# Patient Record
Sex: Male | Born: 1950
Health system: Southern US, Community
[De-identification: ages and names within clinical notes are randomized; demographics above are authoritative.]

## PROBLEM LIST (undated history)

## (undated) DIAGNOSIS — I5022 Chronic systolic (congestive) heart failure: Secondary | ICD-10-CM

## (undated) DIAGNOSIS — J449 Chronic obstructive pulmonary disease, unspecified: Secondary | ICD-10-CM

## (undated) DIAGNOSIS — E669 Obesity, unspecified: Secondary | ICD-10-CM

## (undated) DIAGNOSIS — E119 Type 2 diabetes mellitus without complications: Secondary | ICD-10-CM

---

## 2003-01-14 ENCOUNTER — Inpatient Hospital Stay (HOSPITAL_COMMUNITY): Admission: EM | Admit: 2003-01-14 | Discharge: 2003-01-16 | Payer: Self-pay | Admitting: Emergency Medicine

## 2003-01-15 ENCOUNTER — Encounter: Payer: Self-pay | Admitting: Internal Medicine

## 2005-01-01 ENCOUNTER — Encounter: Admission: RE | Admit: 2005-01-01 | Discharge: 2005-01-01 | Payer: Self-pay | Admitting: Family Medicine

## 2005-04-02 ENCOUNTER — Encounter: Admission: RE | Admit: 2005-04-02 | Discharge: 2005-04-02 | Payer: Self-pay | Admitting: Family Medicine

## 2007-10-09 ENCOUNTER — Encounter: Admission: RE | Admit: 2007-10-09 | Discharge: 2007-10-09 | Payer: Self-pay | Admitting: Family Medicine

## 2010-07-08 ENCOUNTER — Encounter: Payer: Self-pay | Admitting: Family Medicine

## 2010-11-02 NOTE — Discharge Summary (Signed)
[name], [name]                           ACCOUNT NO.:  [id number]   MEDICAL RECORD NO.:  [id number]                   PATIENT TYPE:  INP   LOCATION:  6727                                 FACILITY:  [facility]   PHYSICIAN:  Deirdre Peer. Polite, M.D.              DATE OF BIRTH:  28-May-1951   DATE OF ADMISSION:  01/14/2003  DATE OF DISCHARGE:  01/16/2003                                 DISCHARGE SUMMARY   PRIMARY CARE PHYSICIAN:  Donia Guiles, M.D.   DISCHARGE DIAGNOSES:  1. Escherichia coli urinary tract infection/urosepsis.  2. Possible benign prostatic hypertrophy.  3. Tobacco abuse.   DISCHARGE MEDICATIONS:  1. Flomax 0.4 mg daily x7 days.  2. Cipro 500 mg b.i.d. x5 more days to complete a 7-day course.   PROCEDURES:  1. CT of the abdomen on July 31.  No renal abnormality to suggest focal     pyelonephritis, mild atheromatous changes in the abdominal aorta, left     lower lobe granuloma.  2. CT of the pelvis July 31 revealed mild bladder wall thickening.   LABORATORY DATA:  WBC 17.1, RBC 4.69, hemoglobin 15.7, hematocrit 44.4, MCV  94.7, platelets 241.  Sodium 136, potassium 4, chloride 101, CO2 of 26,  glucose 107, BUN 10, creatinine 1.  Urinalysis with moderate leukocyte, 21-  50 WBCs.  Final culture revealed greater than 100,000 colonies of E. coli.  Blood cultures were negative to date.   DISPOSITION:  The patient was discharged home.   CONDITION ON DISCHARGE:  Stable.   HISTORY OF PRESENT ILLNESS:  This is a 60 year old man, who presented to  [facility] on the 30th after being evaluated by his primary care physician  for fever, abnormal UA, and lower back pain and tachycardia.  The patient  reported symptoms onset was approximately one day prior to presentation.  Initial evaluation noted the patient to be slightly febrile of 101.1,  tachycardic with a pulse of 140.  His rectal exam was negative for any  prostate tenderness, and patient was admitted for further  evaluation and  treatment.   HOSPITAL COURSE:  1. ESCHERICHIA COLI URINARY TRACT INFECTION/UROSEPSIS:  The patient was     admitted.  UA, CMET, and blood cultures were obtained.  He was hydrated     with IV fluids, started on IV Cipro.  Final culture results revealed E.     coli.  A CT of his abdomen and pelvis was done which ruled out     pyelonephritis.  At discharge, he continued to have a low-grade temp. of     99.1, had no lower back pain, was discharged on additional five days of     ciprofloxacin.  2. URINARY FREQUENCY POSSIBLY BENIGN PROSTATIC HYPERTROPHY:  The patient     reported urinary frequency with a possible history of BPH.  He was     discharged on a limited amount  of Flomax for further evaluation to be     left to his primary care physician.  3. TOBACCO ABUSE:  The patient was advised to quit smoking.   DISCHARGE INSTRUCTIONS AND FOLLOW-UP:  The patient was instructed to  complete his antibiotic therapy and to follow up with his primary care M.D.  for further evaluation and a repeat urinalysis.      Stephanie Swaziland, NP                      Deirdre Peer. Polite, M.D.    [name]  D:  01/16/2003  T:  01/17/2003  Job:  [id number]   cc:   Donia Guiles, M.D.  [address] [place]  [place] [address]  Fax: [phone]

## 2011-04-29 ENCOUNTER — Ambulatory Visit
Admission: RE | Admit: 2011-04-29 | Discharge: 2011-04-29 | Disposition: A | Discharge status: Home / Self Care | Payer: BC Managed Care – PPO | Source: Ambulatory Visit | Attending: Family Medicine | Admitting: Family Medicine

## 2011-04-29 ENCOUNTER — Other Ambulatory Visit: Payer: Self-pay | Admitting: Family Medicine

## 2011-04-29 DIAGNOSIS — S93409A Sprain of unspecified ligament of unspecified ankle, initial encounter: Secondary | ICD-10-CM

## 2012-08-31 ENCOUNTER — Ambulatory Visit (INDEPENDENT_AMBULATORY_CARE_PROVIDER_SITE_OTHER): Payer: Self-pay | Admitting: Surgery

## 2012-10-23 ENCOUNTER — Encounter (INDEPENDENT_AMBULATORY_CARE_PROVIDER_SITE_OTHER): Payer: Self-pay | Admitting: Surgery

## 2012-10-23 ENCOUNTER — Ambulatory Visit (INDEPENDENT_AMBULATORY_CARE_PROVIDER_SITE_OTHER): Payer: BC Managed Care – PPO | Admitting: Surgery

## 2012-10-23 VITALS — BP 104/72 | HR 84 | Temp 98.4°F | Resp 16 | Ht 67.0 in | Wt 201.6 lb

## 2012-10-23 DIAGNOSIS — K429 Umbilical hernia without obstruction or gangrene: Secondary | ICD-10-CM

## 2012-10-23 NOTE — Patient Instructions (Signed)
Hernia A hernia occurs when an internal organ pushes out through a weak spot in the abdominal wall. Hernias most commonly occur in the groin and around the navel. Hernias often can be pushed back into place (reduced). Most hernias tend to get worse over time. Some abdominal hernias can get stuck in the opening (irreducible or incarcerated hernia) and cannot be reduced. An irreducible abdominal hernia which is tightly squeezed into the opening is at risk for impaired blood supply (strangulated hernia). A strangulated hernia is a medical emergency. Because of the risk for an irreducible or strangulated hernia, surgery may be recommended to repair a hernia. CAUSES   Heavy lifting.  Prolonged coughing.  Straining to have a bowel movement.  A cut (incision) made during an abdominal surgery. HOME CARE INSTRUCTIONS   Bed rest is not required. You may continue your normal activities.  Avoid lifting more than 10 pounds (4.5 kg) or straining.  Cough gently. If you are a smoker it is best to stop. Even the best hernia repair can break down with the continual strain of coughing. Even if you do not have your hernia repaired, a cough will continue to aggravate the problem.  Do not wear anything tight over your hernia. Do not try to keep it in with an outside bandage or truss. These can damage abdominal contents if they are trapped within the hernia sac.  Eat a normal diet.  Avoid constipation. Straining over long periods of time will increase hernia size and encourage breakdown of repairs. If you cannot do this with diet alone, stool softeners may be used. SEEK IMMEDIATE MEDICAL CARE IF:   You have a fever.  You develop increasing abdominal pain.  You feel nauseous or vomit.  Your hernia is stuck outside the abdomen, looks discolored, feels hard, or is tender.  You have any changes in your bowel habits or in the hernia that are unusual for you.  You have increased pain or swelling around the  hernia.  You cannot push the hernia back in place by applying gentle pressure while lying down. MAKE SURE YOU:   Understand these instructions.  Will watch your condition.  Will get help right away if you are not doing well or get worse. Document Released: 06/03/2005 Document Revised: 08/26/2011 Document Reviewed: 01/21/2008 [employer] Patient Information 2013 [employer].  

## 2012-10-23 NOTE — Progress Notes (Signed)
Patient ID: [name], male   DOB: [birth year], 62 y.o.   MRN: [id number]  No chief complaint on file.   HPI [name] is a 62 y.o. male.                HPI patient sent at the request of [name] for umbilical hernia. It has been present for 4 years. He has mild to moderate discomfort with lifting. Otherwise, it does not bother him much. Denies nausea or vomiting. No change in bowel or bladder function. No redness or drainage from the umbilicus.  History reviewed. No pertinent past medical history.  History reviewed. No pertinent past surgical history.  No family history on file.  Social History History  Substance Use Topics  . Smoking status: Former Smoker    Start date: 10/24/2010  . Smokeless tobacco: Not on file  . Alcohol Use: Yes    Allergies  Allergen Reactions  . Iohexol      Desc: RASH     No current outpatient prescriptions on file.   No current facility-administered medications for this visit.    Review of Systems Review of Systems  Constitutional: Negative for fever, chills and unexpected weight change.  HENT: Negative for hearing loss, congestion, sore throat, trouble swallowing and voice change.   Eyes: Negative for visual disturbance.  Respiratory: Negative for cough and wheezing.   Cardiovascular: Negative for chest pain, palpitations and leg swelling.  Gastrointestinal: Negative for nausea, vomiting, abdominal pain, diarrhea, constipation, blood in stool, abdominal distention, anal bleeding and rectal pain.  Genitourinary: Negative for hematuria and difficulty urinating.  Musculoskeletal: Negative for arthralgias.  Skin: Negative for rash and wound.  Neurological: Negative for seizures, syncope, weakness and headaches.  Hematological: Negative for adenopathy. Does not bruise/bleed easily.  Psychiatric/Behavioral: Negative for confusion.    Blood pressure 104/72, pulse 84, temperature 98.4 F (36.9 C), resp. rate 16, height 5\' 7"  (1.702 m),  weight 201 lb 9.6 oz (91.445 kg).  Physical Exam Physical Exam  Constitutional: He is oriented to person, place, and time. He appears well-developed and well-nourished.  HENT:  Head: Normocephalic and atraumatic.  Eyes: EOM are normal. Pupils are equal, round, and reactive to light.  Neck: Normal range of motion. Neck supple.  Cardiovascular: Normal rate and regular rhythm.   Pulmonary/Chest: Effort normal and breath sounds normal.  Abdominal: Soft. He exhibits no distension. There is no tenderness. There is no rebound.    Musculoskeletal: Normal range of motion.  Neurological: He is alert and oriented to person, place, and time.  Skin: Skin is warm and dry.  Psychiatric: He has a normal mood and affect. His behavior is normal. Judgment and thought content normal.     Assessment    Umbilical hernia     Plan    Pt wishes to repair it at at a later time.  He will call to set up surgery.       [name] A. 10/23/2012, 9:42 AM

## 2014-03-22 ENCOUNTER — Ambulatory Visit: Payer: BC Managed Care – PPO

## 2014-03-29 ENCOUNTER — Ambulatory Visit: Payer: BC Managed Care – PPO

## 2014-04-05 ENCOUNTER — Ambulatory Visit: Payer: BC Managed Care – PPO

## 2014-06-03 ENCOUNTER — Other Ambulatory Visit: Payer: Self-pay | Admitting: Family Medicine

## 2014-06-06 ENCOUNTER — Other Ambulatory Visit: Payer: Self-pay | Admitting: Family Medicine

## 2014-06-06 DIAGNOSIS — R140 Abdominal distension (gaseous): Secondary | ICD-10-CM

## 2014-06-06 DIAGNOSIS — R1084 Generalized abdominal pain: Secondary | ICD-10-CM

## 2014-06-20 ENCOUNTER — Ambulatory Visit
Admission: RE | Admit: 2014-06-20 | Discharge: 2014-06-20 | Disposition: A | Discharge status: Home / Self Care | Payer: BLUE CROSS/BLUE SHIELD | Source: Ambulatory Visit | Attending: Family Medicine | Admitting: Family Medicine

## 2014-06-20 DIAGNOSIS — R140 Abdominal distension (gaseous): Secondary | ICD-10-CM

## 2014-06-20 DIAGNOSIS — R1084 Generalized abdominal pain: Secondary | ICD-10-CM

## 2014-06-20 MED ORDER — IOHEXOL 300 MG/ML  SOLN
125.0000 mL | Freq: Once | INTRAMUSCULAR | Status: AC | PRN
  Administered 2014-06-20: 125 mL via INTRAVENOUS

## 2014-07-13 ENCOUNTER — Encounter (HOSPITAL_COMMUNITY): Payer: Self-pay | Admitting: Family Medicine

## 2014-07-13 ENCOUNTER — Emergency Department (HOSPITAL_COMMUNITY): Payer: BLUE CROSS/BLUE SHIELD

## 2014-07-13 ENCOUNTER — Inpatient Hospital Stay (HOSPITAL_COMMUNITY)
Admission: EM | Admit: 2014-07-13 | Discharge: 2014-07-19 | DRG: 286 | Disposition: A | Discharge status: Home / Self Care | Payer: BLUE CROSS/BLUE SHIELD | Attending: Internal Medicine | Admitting: Internal Medicine

## 2014-07-13 DIAGNOSIS — K5900 Constipation, unspecified: Secondary | ICD-10-CM | POA: Diagnosis present

## 2014-07-13 DIAGNOSIS — I493 Ventricular premature depolarization: Secondary | ICD-10-CM

## 2014-07-13 DIAGNOSIS — J449 Chronic obstructive pulmonary disease, unspecified: Secondary | ICD-10-CM | POA: Diagnosis present

## 2014-07-13 DIAGNOSIS — Z7982 Long term (current) use of aspirin: Secondary | ICD-10-CM

## 2014-07-13 DIAGNOSIS — E1110 Type 2 diabetes mellitus with ketoacidosis without coma: Secondary | ICD-10-CM | POA: Diagnosis present

## 2014-07-13 DIAGNOSIS — E1165 Type 2 diabetes mellitus with hyperglycemia: Secondary | ICD-10-CM | POA: Diagnosis present

## 2014-07-13 DIAGNOSIS — Z794 Long term (current) use of insulin: Secondary | ICD-10-CM

## 2014-07-13 DIAGNOSIS — Z6830 Body mass index (BMI) 30.0-30.9, adult: Secondary | ICD-10-CM | POA: Diagnosis not present

## 2014-07-13 DIAGNOSIS — E669 Obesity, unspecified: Secondary | ICD-10-CM | POA: Diagnosis present

## 2014-07-13 DIAGNOSIS — I340 Nonrheumatic mitral (valve) insufficiency: Secondary | ICD-10-CM | POA: Diagnosis present

## 2014-07-13 DIAGNOSIS — R0600 Dyspnea, unspecified: Secondary | ICD-10-CM | POA: Diagnosis present

## 2014-07-13 DIAGNOSIS — R0602 Shortness of breath: Secondary | ICD-10-CM | POA: Diagnosis present

## 2014-07-13 DIAGNOSIS — E119 Type 2 diabetes mellitus without complications: Secondary | ICD-10-CM

## 2014-07-13 DIAGNOSIS — I5022 Chronic systolic (congestive) heart failure: Secondary | ICD-10-CM

## 2014-07-13 DIAGNOSIS — I272 Other secondary pulmonary hypertension: Secondary | ICD-10-CM | POA: Diagnosis present

## 2014-07-13 DIAGNOSIS — I471 Supraventricular tachycardia: Secondary | ICD-10-CM

## 2014-07-13 DIAGNOSIS — J189 Pneumonia, unspecified organism: Secondary | ICD-10-CM | POA: Diagnosis present

## 2014-07-13 DIAGNOSIS — I5041 Acute combined systolic (congestive) and diastolic (congestive) heart failure: Principal | ICD-10-CM

## 2014-07-13 DIAGNOSIS — Z87891 Personal history of nicotine dependence: Secondary | ICD-10-CM | POA: Diagnosis not present

## 2014-07-13 DIAGNOSIS — R000 Tachycardia, unspecified: Secondary | ICD-10-CM | POA: Diagnosis present

## 2014-07-13 DIAGNOSIS — Z888 Allergy status to other drugs, medicaments and biological substances status: Secondary | ICD-10-CM | POA: Diagnosis not present

## 2014-07-13 DIAGNOSIS — I255 Ischemic cardiomyopathy: Secondary | ICD-10-CM | POA: Diagnosis present

## 2014-07-13 DIAGNOSIS — G8929 Other chronic pain: Secondary | ICD-10-CM

## 2014-07-13 DIAGNOSIS — R079 Chest pain, unspecified: Secondary | ICD-10-CM | POA: Diagnosis present

## 2014-07-13 DIAGNOSIS — R140 Abdominal distension (gaseous): Secondary | ICD-10-CM

## 2014-07-13 DIAGNOSIS — R1011 Right upper quadrant pain: Secondary | ICD-10-CM

## 2014-07-13 DIAGNOSIS — I5021 Acute systolic (congestive) heart failure: Secondary | ICD-10-CM

## 2014-07-13 DIAGNOSIS — E8770 Fluid overload, unspecified: Secondary | ICD-10-CM

## 2014-07-13 HISTORY — DX: Chronic obstructive pulmonary disease, unspecified: J44.9

## 2014-07-13 HISTORY — DX: Type 2 diabetes mellitus without complications: E11.9

## 2014-07-13 HISTORY — DX: Obesity, unspecified: E66.9

## 2014-07-13 HISTORY — DX: Chronic systolic (congestive) heart failure: I50.22

## 2014-07-13 LAB — HEPATIC FUNCTION PANEL
ALT: 19 U/L (ref 0–53)
AST: 19 U/L (ref 0–37)
Albumin: 3.6 g/dL (ref 3.5–5.2)
Alkaline Phosphatase: 58 U/L (ref 39–117)
Bilirubin, Direct: 0.3 mg/dL (ref 0.0–0.5)
Indirect Bilirubin: 1.7 mg/dL — ABNORMAL HIGH (ref 0.3–0.9)
Total Bilirubin: 2 mg/dL — ABNORMAL HIGH (ref 0.3–1.2)
Total Protein: 7.2 g/dL (ref 6.0–8.3)

## 2014-07-13 LAB — CBC
HCT: 48.6 % (ref 39.0–52.0)
Hemoglobin: 16.4 g/dL (ref 13.0–17.0)
MCH: 31.7 pg (ref 26.0–34.0)
MCHC: 33.7 g/dL (ref 30.0–36.0)
MCV: 93.8 fL (ref 78.0–100.0)
Platelets: 228 10*3/uL (ref 150–400)
RBC: 5.18 MIL/uL (ref 4.22–5.81)
RDW: 13.8 % (ref 11.5–15.5)
WBC: 8.9 10*3/uL (ref 4.0–10.5)

## 2014-07-13 LAB — BASIC METABOLIC PANEL
Anion gap: 10 (ref 5–15)
BUN: 9 mg/dL (ref 6–23)
CO2: 25 mmol/L (ref 19–32)
Calcium: 9 mg/dL (ref 8.4–10.5)
Chloride: 101 mmol/L (ref 96–112)
Creatinine, Ser: 0.95 mg/dL (ref 0.50–1.35)
GFR calc Af Amer: >90 mL/min (ref >90)
GFR calc non Af Amer: 86 mL/min — ABNORMAL LOW (ref >90)
Glucose, Bld: 155 mg/dL — ABNORMAL HIGH (ref 70–99)
Potassium: 3.9 mmol/L (ref 3.5–5.1)
Sodium: 136 mmol/L (ref 135–145)

## 2014-07-13 LAB — STREP PNEUMONIAE URINARY ANTIGEN: Strep Pneumo Urinary Antigen: NEGATIVE

## 2014-07-13 LAB — I-STAT TROPONIN, ED: Troponin i, poc: 0.02 ng/mL (ref 0.00–0.08)

## 2014-07-13 LAB — TROPONIN I: Troponin I: <0.03 ng/mL (ref <0.031)

## 2014-07-13 LAB — TSH: TSH: 1.088 u[IU]/mL (ref 0.350–4.500)

## 2014-07-13 LAB — LIPASE, BLOOD: Lipase: 43 U/L (ref 11–59)

## 2014-07-13 LAB — BRAIN NATRIURETIC PEPTIDE: B Natriuretic Peptide: 877.1 pg/mL — ABNORMAL HIGH (ref 0.0–100.0)

## 2014-07-13 MED ORDER — ZOLPIDEM TARTRATE 5 MG PO TABS
5.0000 mg | ORAL_TABLET | Freq: Once | ORAL | Status: AC
  Administered 2014-07-13: 5 mg via ORAL
  Filled 2014-07-13: qty 1

## 2014-07-13 MED ORDER — ACETAMINOPHEN 650 MG RE SUPP: 650.0000 mg | Freq: Four times a day (QID) | RECTAL | Status: DC | PRN

## 2014-07-13 MED ORDER — AZITHROMYCIN 250 MG PO TABS
500.0000 mg | ORAL_TABLET | Freq: Once | ORAL | Status: AC
  Administered 2014-07-13: 500 mg via ORAL
  Filled 2014-07-13: qty 2

## 2014-07-13 MED ORDER — SODIUM CHLORIDE 0.9 % IV BOLUS (SEPSIS)
1000.0000 mL | Freq: Once | INTRAVENOUS | Status: AC
  Administered 2014-07-13: 1000 mL via INTRAVENOUS

## 2014-07-13 MED ORDER — ACETAMINOPHEN 325 MG PO TABS: 650.0000 mg | ORAL_TABLET | Freq: Four times a day (QID) | ORAL | Status: DC | PRN

## 2014-07-13 MED ORDER — ASPIRIN EC 81 MG PO TBEC
81.0000 mg | DELAYED_RELEASE_TABLET | Freq: Every day | ORAL | Status: DC
  Administered 2014-07-13 – 2014-07-19 (×7): 81 mg via ORAL
  Filled 2014-07-13 (×6): qty 1

## 2014-07-13 MED ORDER — SODIUM CHLORIDE 0.9 % IV SOLN: 250.0000 mL | INTRAVENOUS | Status: DC | PRN

## 2014-07-13 MED ORDER — SODIUM CHLORIDE 0.9 % IJ SOLN
3.0000 mL | Freq: Two times a day (BID) | INTRAMUSCULAR | Status: DC
  Administered 2014-07-14 – 2014-07-19 (×10): 3 mL via INTRAVENOUS

## 2014-07-13 MED ORDER — SODIUM CHLORIDE 0.9 % IJ SOLN
3.0000 mL | Freq: Two times a day (BID) | INTRAMUSCULAR | Status: DC
Start: 2014-07-13 — End: 2014-07-15
  Administered 2014-07-13 – 2014-07-14 (×2): 3 mL via INTRAVENOUS

## 2014-07-13 MED ORDER — ENOXAPARIN SODIUM 40 MG/0.4ML ~~LOC~~ SOLN
40.0000 mg | SUBCUTANEOUS | Status: DC
  Administered 2014-07-13 – 2014-07-17 (×5): 40 mg via SUBCUTANEOUS
  Filled 2014-07-13 (×6): qty 0.4

## 2014-07-13 MED ORDER — BUDESONIDE-FORMOTEROL FUMARATE 160-4.5 MCG/ACT IN AERO
1.0000 | INHALATION_SPRAY | Freq: Two times a day (BID) | RESPIRATORY_TRACT | Status: DC
  Administered 2014-07-13 – 2014-07-19 (×12): 1 via RESPIRATORY_TRACT
  Filled 2014-07-13: qty 6

## 2014-07-13 MED ORDER — PREDNISONE 50 MG PO TABS
50.0000 mg | ORAL_TABLET | Freq: Once | ORAL | Status: AC
  Administered 2014-07-14: 50 mg via ORAL
  Filled 2014-07-13: qty 1

## 2014-07-13 MED ORDER — CEFTRIAXONE SODIUM IN DEXTROSE 20 MG/ML IV SOLN
1.0000 g | INTRAVENOUS | Status: DC
  Administered 2014-07-14 – 2014-07-17 (×4): 1 g via INTRAVENOUS
  Filled 2014-07-13 (×5): qty 50

## 2014-07-13 MED ORDER — DEXTROSE 5 % IV SOLN
500.0000 mg | INTRAVENOUS | Status: DC
  Administered 2014-07-14: 500 mg via INTRAVENOUS
  Filled 2014-07-13 (×2): qty 500

## 2014-07-13 MED ORDER — OXYCODONE HCL 5 MG PO TABS: 5.0000 mg | ORAL_TABLET | ORAL | Status: DC | PRN

## 2014-07-13 MED ORDER — CEFTRIAXONE SODIUM 1 G IJ SOLR
1.0000 g | Freq: Once | INTRAMUSCULAR | Status: AC
  Administered 2014-07-13: 1 g via INTRAVENOUS
  Filled 2014-07-13: qty 10

## 2014-07-13 MED ORDER — SODIUM CHLORIDE 0.9 % IJ SOLN: 3.0000 mL | INTRAMUSCULAR | Status: DC | PRN

## 2014-07-13 MED ORDER — PREDNISONE 20 MG PO TABS
50.0000 mg | ORAL_TABLET | Freq: Once | ORAL | Status: AC
  Administered 2014-07-13: 50 mg via ORAL
  Filled 2014-07-13: qty 3

## 2014-07-13 MED ORDER — MORPHINE SULFATE 2 MG/ML IJ SOLN
1.0000 mg | INTRAMUSCULAR | Status: DC | PRN
Start: 2014-07-13 — End: 2014-07-20

## 2014-07-13 MED ORDER — PREDNISONE 50 MG PO TABS
50.0000 mg | ORAL_TABLET | Freq: Once | ORAL | Status: AC
  Administered 2014-07-13: 50 mg via ORAL
  Filled 2014-07-13: qty 1

## 2014-07-13 MED ORDER — ONDANSETRON HCL 4 MG PO TABS: 4.0000 mg | ORAL_TABLET | Freq: Four times a day (QID) | ORAL | Status: DC | PRN

## 2014-07-13 MED ORDER — FUROSEMIDE 10 MG/ML IJ SOLN
20.0000 mg | Freq: Once | INTRAMUSCULAR | Status: AC
  Administered 2014-07-13: 20 mg via INTRAVENOUS
  Filled 2014-07-13: qty 2

## 2014-07-13 MED ORDER — ONDANSETRON HCL 4 MG/2ML IJ SOLN: 4.0000 mg | Freq: Four times a day (QID) | INTRAMUSCULAR | Status: DC | PRN

## 2014-07-13 NOTE — ED Notes (Signed)
Pt here for non radiating left sided chest pain that started yesterday. sts that he had a colonoscopy Monday and the doctors were concerened due to high heart rate. Pt tachy at 130 at triage. Pt sts has been having abdominal pain and the feeling of being bloated for a while.

## 2014-07-13 NOTE — ED Notes (Signed)
Admitting MD at bedside.

## 2014-07-13 NOTE — H&P (Signed)
[facility] History and Physical  LON KLIPPEL PRF:[id number] DOB: 1950/07/10 DOA: 07/13/2014   PCP: Mayra Neer, MD  Specialists: He is been seen recently by Dr. Oletta Lamas with gastroenterology  Chief Complaint: Left-sided chest pain with cough and shortness of breath  HPI: [name] is a 64 y.o. male with a past medical history of COPD not on home oxygen who otherwise does not have any other health problems who was in his usual state of health till about 2-3 days ago when he started developing pain in the left side of his chest. He describes it as a sharp pain without any radiation. 4-5 out of 10 in intensity. Associated with shortness of breath and a cough with clear expectoration without any blood. He has also noticed some difficulty breathing with lying down. Denies any fever per se. No chills. Has been a little dizzy. Has noticed some leg swelling. The chest pain increases with coughing and deep breathing. He had cold like symptoms earlier this month. His grandchildren have been sick. Has had nausea but no vomiting. He's never had similar complaints before. For the past many months he's had abdominal bloating and distention for which he has been worked up by his gastroenterologist. He underwent a colonoscopy on Monday, which apparently was unremarkable.  Home Medications: Prior to Admission medications   Medication Sig Start Date End Date Taking? Authorizing Provider  albuterol (PROVENTIL HFA;VENTOLIN HFA) 108 (90 BASE) MCG/ACT inhaler Inhale 1 puff into the lungs every 6 (six) hours as needed for wheezing or shortness of breath (sometimes allergy).   Yes Historical Provider, MD  budesonide-formoterol (SYMBICORT) 160-4.5 MCG/ACT inhaler Inhale 1 puff into the lungs 2 (two) times daily.   Yes Historical Provider, MD    Allergies:  Allergies  Allergen Reactions  . Iodine Solution [Povidone Iodine] Rash    Past Medical History: Past Medical History  Diagnosis Date  . COPD  (chronic obstructive pulmonary disease)     Social History: He lives in [place] with his wife. Quit smoking many years ago. No alcohol use. No illicit drug use. Usually independent with daily activities.  Family History: He denies any health problems in his family.  Review of Systems - History obtained from the patient General ROS: positive for  - fatigue Psychological ROS: negative Ophthalmic ROS: negative ENT ROS: negative Allergy and Immunology ROS: negative Hematological and Lymphatic ROS: negative Endocrine ROS: negative Respiratory ROS: As in history of present illness Cardiovascular ROS: As in history of present illness Gastrointestinal ROS: As in history of present illness Genito-Urinary ROS: no dysuria, trouble voiding, or hematuria Musculoskeletal ROS: negative Neurological ROS: no TIA or stroke symptoms Dermatological ROS: negative  Physical Examination  Filed Vitals:   07/13/14 0945 07/13/14 1000 07/13/14 1130 07/13/14 1200  BP: 116/91 121/93 112/91 110/95  Pulse: 130 128 126 128  Temp:      Resp: _0 SpO2: 98% 93% 95% 96%    BP 110/95 mmHg  Pulse 128  Temp(Src) 97.6 F (36.4 C)  Resp 23  SpO2 96%  General appearance: alert, cooperative, appears stated age, no distress and moderately obese Head: Normocephalic, without obvious abnormality, atraumatic Eyes: conjunctivae/corneas clear. PERRL, EOM's intact. Throat: lips, mucosa, and tongue normal; teeth and gums normal Neck: no adenopathy, no carotid bruit, no JVD, supple, symmetrical, trachea midline and thyroid not enlarged, symmetric, no tenderness/mass/nodules Resp: Crackles left base. No wheezing. No rhonchi. Cardio: regular rate and rhythm, S1, S2 normal, no murmur, click, rub or gallop  GI: soft, non-tender; bowel sounds normal; no masses,  no organomegaly Extremities: Minimal edema bilateral lower extremities. Pulses: 2+ and symmetric Skin: Skin color, texture, turgor normal. No rashes or  lesions Lymph nodes: Cervical, supraclavicular, and axillary nodes normal. Neurologic: No focal deficits.  Laboratory Data: Results for orders placed or performed during the hospital encounter of 07/13/14 (from the past 48 hour(s))  Basic metabolic panel     Status: Abnormal   Collection Time: 07/13/14  9:13 AM  Result Value Ref Range   Sodium 136 135 - 145 mmol/L   Potassium 3.9 3.5 - 5.1 mmol/L   Chloride 101 96 - 112 mmol/L   CO2 25 19 - 32 mmol/L   Glucose, Bld 155 (H) 70 - 99 mg/dL   BUN 9 6 - 23 mg/dL   Creatinine, Ser 0.95 0.50 - 1.35 mg/dL   Calcium 9.0 8.4 - 10.5 mg/dL   GFR calc non Af Amer 86 (L) >90 mL/min   GFR calc Af Amer >90 >90 mL/min    Comment: (NOTE) The eGFR has been calculated using the CKD EPI equation. This calculation has not been validated in all clinical situations. eGFR's persistently <90 mL/min signify possible Chronic Kidney Disease.    Anion gap 10 5 - 15  CBC     Status: None   Collection Time: 07/13/14  9:23 AM  Result Value Ref Range   WBC 8.9 4.0 - 10.5 K/uL   RBC 5.18 4.22 - 5.81 MIL/uL   Hemoglobin 16.4 13.0 - 17.0 g/dL   HCT 48.6 39.0 - 52.0 %   MCV 93.8 78.0 - 100.0 fL   MCH 31.7 26.0 - 34.0 pg   MCHC 33.7 30.0 - 36.0 g/dL   RDW 13.8 11.5 - 15.5 %   Platelets 228 150 - 400 K/uL  BNP (order ONLY if patient complains of dyspnea/SOB AND you have documented it for THIS visit)     Status: Abnormal   Collection Time: 07/13/14  9:23 AM  Result Value Ref Range   B Natriuretic Peptide 877.1 (H) 0.0 - 100.0 pg/mL  I-stat troponin, ED (not at [facility])     Status: None   Collection Time: 07/13/14  9:29 AM  Result Value Ref Range   Troponin i, poc 0.02 0.00 - 0.08 ng/mL   Comment 3            Comment: Due to the release kinetics of cTnI, a negative result within the first hours of the onset of symptoms does not rule out myocardial infarction with certainty. If myocardial infarction is still suspected, repeat the test at appropriate  intervals.   Hepatic function panel     Status: Abnormal   Collection Time: 07/13/14  9:57 AM  Result Value Ref Range   Total Protein 7.2 6.0 - 8.3 g/dL   Albumin 3.6 3.5 - 5.2 g/dL   AST 19 0 - 37 U/L   ALT 19 0 - 53 U/L   Alkaline Phosphatase 58 39 - 117 U/L   Total Bilirubin 2.0 (H) 0.3 - 1.2 mg/dL   Bilirubin, Direct 0.3 0.0 - 0.5 mg/dL    Comment: Please note change in reference range.   Indirect Bilirubin 1.7 (H) 0.3 - 0.9 mg/dL  Lipase, blood     Status: None   Collection Time: 07/13/14  9:57 AM  Result Value Ref Range   Lipase 43 11 - 59 U/L    Radiology Reports: Dg Chest 2 View  07/13/2014   CLINICAL DATA:  64 year old male with shortness of breath cough and chest tightness for 2 days. Current history of COPD. Initial encounter.  EXAM: CHEST  2 VIEW  COMPARISON:  CT Abdomen and Pelvis 06/20/2014. Chest radiographs 06/18/2014 and earlier.  FINDINGS: Stable somewhat low lung volumes. Vague increased left mid lung opacity from the prior studies, but at the same time increased lower lobe opacity appears regressed. No pneumothorax. No pleural effusion identified. Stable cardiac size and mediastinal contours. Visualized tracheal air column is within normal limits. Underlying chronic increased interstitial markings. No acute osseous abnormality identified.  IMPRESSION: Vague increased left mid lung opacity compatible with acute pneumonia superimposed on chronic lung disease.  Post treatment radiographs recommended to document resolution.   Electronically Signed   By: Lars Pinks M.D.   On: 07/13/2014 09:39   US Abdomen Complete  07/13/2014   CLINICAL DATA:  64 year old male with 1 day history of right upper quadrant pain  EXAM: ULTRASOUND ABDOMEN COMPLETE  COMPARISON:  None.  FINDINGS: Gallbladder: No gallstones or wall thickening visualized. No sonographic Murphy sign noted.  Common bile duct: Diameter: Within normal limits at 5 mm  Liver: No focal lesion identified. Within normal limits in  parenchymal echogenicity. The main portal vein is patent with normal hepatopetal flow.  IVC: No abnormality visualized.  Pancreas: Visualized portion unremarkable. The tail is largely obscured by overlying bowel gas.  Spleen: Size and appearance within normal limits.  Right Kidney: Length: 13.2 cm. Echogenicity within normal limits. No mass or hydronephrosis visualized.  Left Kidney: Length: 13.6 cm. Echogenicity within normal limits. No mass or hydronephrosis visualized.  Abdominal aorta: Largely obscured by bowel gas. However, recent CTA of the abdomen and pelvis demonstrates a normal caliber aorta.  Other findings:  Small right-sided pleural effusion.  IMPRESSION: 1. No acute abnormality in the abdomen to explain the patient's clinical symptoms. 2. Small right-sided pleural effusion noted incidentally.   Electronically Signed   By: Jacqulynn Cadet M.D.   On: 07/13/2014 11:19    Electrocardiogram: EKG shows sinus tachycardia at 130 bpm. Normal axis. Intervals are normal. No concerning ST changes. Nonspecific T wave changes.  Problem List  Principal Problem:   CAP (community acquired pneumonia) Active Problems:   Chest pain   Sinus tachycardia   Dyspnea   Assessment: This is a 64 year old Caucasian male who presents with left-sided chest pain. His chest x-ray raises the possibility of an infiltrate in the left lower lung. However, he is afebrile and has normal WBC. He also has pleuritic chest pain. He also was found to be tachycardic. BNP is elevated. He has symptoms suggestive of orthopnea. Pneumonia could be responsible for his symptoms but he needs further evaluation.  Plan: #1 chest pain and dyspnea with sinus tachycardia: He likely has pneumonia, which could be the reason for his presentation. But, surprisingly, WBC is normal and he is afebrile. Since etiology is not completely clear he will benefit from a CT angiogram of his chest. He has reported allergy to iodine. He'll be placed on a 13  hour protocol with steroids. CT scan will be done subsequently. In the meantime, monitor him on telemetry. Considering his history of orthopnea we will order echocardiogram. Troponin will be checked as well. Check TSH  #2 Possible community-acquired pneumonia: Treat with ceftriaxone and azithromycin for now.  #3 chronic symptoms of abdominal distention: Workup as outpatient. Colonoscopy on Monday was apparently unremarkable per patient. Report not available in our system. Monitor clinically. He did have an ultrasound of his  abdomen in the ED which did not show any concerning findings. LFTs were normal except for mild hyperbilirubinemia. No further workup as inpatient.   #4 history of COPD: Continue with his inhaler.  DVT Prophylaxis: Lovenox Code Status: Full code Family Communication: Discussed with the patient  Disposition Plan: Admit to telemetry   Further management decisions will depend on results of further testing and patient's response to treatment.   [facility]  [facility] Pager [phone]  If 7PM-7AM, please contact night-coverage [email or web address] Password [facility]  07/13/2014, 12:19 PM

## 2014-07-13 NOTE — ED Notes (Signed)
Attempted report 

## 2014-07-13 NOTE — Progress Notes (Addendum)
Pt having quadrigeminy PVCs. Pt asymptomatic. HR in the 130s. MD made aware. New orders given. Will continue to monitor patient for further changes in condition.

## 2014-07-13 NOTE — ED Provider Notes (Signed)
CSN: [id number]     Arrival date & time 07/13/14  4098 History   First MD Initiated Contact with Patient 07/13/14 0912     Chief Complaint  Patient presents with  . Chest Pain     (Consider location/radiation/quality/duration/timing/severity/associated sxs/prior Treatment) Patient is a 64 y.o. male presenting with chest pain. The history is provided by the patient.  Chest Pain Pain location:  L chest Pain quality: aching and sharp   Pain radiates to:  Does not radiate Pain radiates to the back: no   Pain severity:  Moderate Onset quality:  Gradual Duration:  4 days Timing:  Intermittent Progression:  Partially resolved Chronicity:  New Relieved by:  None tried Worsened by:  Coughing Ineffective treatments:  None tried Associated symptoms: abdominal pain, cough, nausea and shortness of breath   Associated symptoms: no fever, no lower extremity edema and not vomiting   Associated symptoms comment:  Patient states for the last 1-2 months he's had worsening upper abdominal pain, bloating, nausea that he saw his regular doctor for the beginning of January and had a normal CT scan and saw GI on Monday for a colonoscopy which was normal. Also in the last few weeks he's noted worsening shortness of breath and now having left-sided chest pain and a productive cough. He denies fever is still using his inhalers. Also of note when he went in for his colonoscopy 3 days ago they said he had an elevated heart rate which is the first time he ever heard of that. They thought it may be due to dehydration and the colon prep. Risk factors: male sex   Risk factors: no diabetes mellitus, no hypertension, no immobilization, no smoking and no surgery   Risk factors comment:  Copd   Past Medical History  Diagnosis Date  . COPD (chronic obstructive pulmonary disease)    History reviewed. No pertinent past surgical history. History reviewed. No pertinent family history. History  Substance Use Topics  .  Smoking status: Former Smoker    Start date: 10/24/2010  . Smokeless tobacco: Not on file  . Alcohol Use: Yes    Review of Systems  Constitutional: Negative for fever.  Respiratory: Positive for cough and shortness of breath.   Cardiovascular: Positive for chest pain.  Gastrointestinal: Positive for nausea and abdominal pain. Negative for vomiting.  All other systems reviewed and are negative.     Allergies  Iohexol  Home Medications   Prior to Admission medications   Not on File   BP 130/93 mmHg  Pulse 132  Temp(Src) 97.6 F (36.4 C)  Resp 20  SpO2 95% Physical Exam  Constitutional: He is oriented to person, place, and time. He appears well-developed and well-nourished. No distress.  HENT:  Head: Normocephalic and atraumatic.  Mouth/Throat: Oropharynx is clear and moist.  Eyes: Conjunctivae and EOM are normal. Pupils are equal, round, and reactive to light.  Neck: Normal range of motion. Neck supple.  Cardiovascular: Regular rhythm and intact distal pulses.  Tachycardia present.   No murmur heard. Pulmonary/Chest: Effort normal. Tachypnea noted. No respiratory distress. He has no wheezes. He has rhonchi in the left lower field. He has no rales.  Abdominal: Soft. Bowel sounds are normal. He exhibits distension. There is tenderness in the right upper quadrant and epigastric area. There is no rebound and no guarding.  Musculoskeletal: Normal range of motion. He exhibits no edema or tenderness.  Neurological: He is alert and oriented to person, place, and time.  Skin: Skin  is warm and dry. No rash noted. No erythema.  Psychiatric: He has a normal mood and affect. His behavior is normal.  Nursing note and vitals reviewed.   ED Course  Procedures (including critical care time) Labs Review Labs Reviewed  BASIC METABOLIC PANEL - Abnormal; Notable for the following:    Glucose, Bld 155 (*)    GFR calc non Af Amer 86 (*)    All other components within normal limits   BRAIN NATRIURETIC PEPTIDE - Abnormal; Notable for the following:    B Natriuretic Peptide 877.1 (*)    All other components within normal limits  HEPATIC FUNCTION PANEL - Abnormal; Notable for the following:    Total Bilirubin 2.0 (*)    Indirect Bilirubin 1.7 (*)    All other components within normal limits  CBC  LIPASE, BLOOD  I-STAT TROPOININ, ED    Imaging Review Dg Chest 2 View  07/13/2014   CLINICAL DATA:  64 year old male with shortness of breath cough and chest tightness for 2 days. Current history of COPD. Initial encounter.  EXAM: CHEST  2 VIEW  COMPARISON:  CT Abdomen and Pelvis 06/20/2014. Chest radiographs 06/18/2014 and earlier.  FINDINGS: Stable somewhat low lung volumes. Vague increased left mid lung opacity from the prior studies, but at the same time increased lower lobe opacity appears regressed. No pneumothorax. No pleural effusion identified. Stable cardiac size and mediastinal contours. Visualized tracheal air column is within normal limits. Underlying chronic increased interstitial markings. No acute osseous abnormality identified.  IMPRESSION: Vague increased left mid lung opacity compatible with acute pneumonia superimposed on chronic lung disease.  Post treatment radiographs recommended to document resolution.   Electronically Signed   By: Augusto Gamble M.D.   On: 07/13/2014 09:39     EKG Interpretation   Date/Time:  Wednesday July 13 2014 09:09:02 EST Ventricular Rate:  130 PR Interval:  152 QRS Duration: 96 QT Interval:  302 QTC Calculation: 444 R Axis:   -6 Text Interpretation:  Sinus tachycardia Anteroseptal infarct , age  undetermined No significant change since last tracing Confirmed by  [facility]  MD, Alphonzo Lemmings ([id number]) on 07/13/2014 9:31:39 AM      MDM   Final diagnoses:  RUQ pain  CAP (community acquired pneumonia)  Hypervolemia, unspecified hypervolemia type    Patient with gradual worsening shortness of breath over the last few weeks with  productive cough and now left-sided chest pain which is worse after prolonged coughing. Of note he's also had GI issues for over a month where he has bloating, nausea, epigastric pain. Patient had a normal CT scan and saw GI on Monday where he underwent colonoscopy. Of note when he arrived for his colonoscopy has heart rate was elevated and up to 150 throughout the entire procedure. His colonoscopy was normal except for 2 small polyps that were removed. He still continues to have the abdominal pain and bloating. He does have a history of COPD and is using Spiriva daily and pro-air intermittently. Last use of pro-air was last night. On arrival here patient's oxygen saturation is 95% on room air and his heart rate is in the 130s. On EKG is sinus tachycardia.  Patient is low risk for PE and no prior cardiac history. Troponin is normal however chest x-ray shows signs of a new left lower lobe opacity consistent with pneumonia which would describe the cause of his shortness of breath and new productive cough.  Also with his abdominal issues he has not had evaluation of  his gallbladder and given his symptoms feel he is a candidate for a right upper quadrant ultrasound.  CBC, CMP, lipase, right upper quadrant ultrasound pending  11:53 AM Labs with significant elevated BNP.  U/S normal.  And on further evaluation of the patient he states his shortness of breath is much more severe with lying down concerning for possible new fluid overload. Patient was given Lasix, Rocephin, azithromycin and admitted for further care  Gwyneth Sprout, MD 07/13/14 1155

## 2014-07-14 ENCOUNTER — Inpatient Hospital Stay (HOSPITAL_COMMUNITY): Payer: BLUE CROSS/BLUE SHIELD

## 2014-07-14 ENCOUNTER — Encounter (HOSPITAL_COMMUNITY): Payer: Self-pay

## 2014-07-14 DIAGNOSIS — I209 Angina pectoris, unspecified: Secondary | ICD-10-CM

## 2014-07-14 DIAGNOSIS — R1011 Right upper quadrant pain: Secondary | ICD-10-CM

## 2014-07-14 DIAGNOSIS — G8929 Other chronic pain: Secondary | ICD-10-CM

## 2014-07-14 DIAGNOSIS — R1010 Upper abdominal pain, unspecified: Secondary | ICD-10-CM

## 2014-07-14 LAB — CBC
HCT: 44.9 % (ref 39.0–52.0)
HEMOGLOBIN: 14.8 g/dL (ref 13.0–17.0)
MCH: 31.9 pg (ref 26.0–34.0)
MCHC: 33 g/dL (ref 30.0–36.0)
MCV: 96.8 fL (ref 78.0–100.0)
PLATELETS: 208 10*3/uL (ref 150–400)
RBC: 4.64 MIL/uL (ref 4.22–5.81)
RDW: 13.9 % (ref 11.5–15.5)
WBC: 5 10*3/uL (ref 4.0–10.5)

## 2014-07-14 LAB — LEGIONELLA ANTIGEN, URINE

## 2014-07-14 LAB — TROPONIN I

## 2014-07-14 LAB — BASIC METABOLIC PANEL
ANION GAP: 7 (ref 5–15)
BUN: 12 mg/dL (ref 6–23)
CHLORIDE: 102 mmol/L (ref 96–112)
CO2: 25 mmol/L (ref 19–32)
CREATININE: 0.99 mg/dL (ref 0.50–1.35)
Calcium: 8.3 mg/dL — ABNORMAL LOW (ref 8.4–10.5)
GFR calc Af Amer: >90 mL/min (ref >90)
GFR calc non Af Amer: 85 mL/min — ABNORMAL LOW (ref >90)
Glucose, Bld: 247 mg/dL — ABNORMAL HIGH (ref 70–99)
Potassium: 4.2 mmol/L (ref 3.5–5.1)
Sodium: 134 mmol/L — ABNORMAL LOW (ref 135–145)

## 2014-07-14 MED ORDER — IOHEXOL 350 MG/ML SOLN
100.0000 mL | Freq: Once | INTRAVENOUS | Status: AC | PRN
  Administered 2014-07-14: 100 mL via INTRAVENOUS

## 2014-07-14 MED ORDER — STERILE WATER FOR INJECTION IJ SOLN
INTRAMUSCULAR | Status: AC
  Filled 2014-07-14: qty 10

## 2014-07-14 MED ORDER — METOPROLOL TARTRATE 25 MG PO TABS
25.0000 mg | ORAL_TABLET | Freq: Two times a day (BID) | ORAL | Status: DC
  Administered 2014-07-14 – 2014-07-15 (×3): 25 mg via ORAL
  Filled 2014-07-14 (×4): qty 1

## 2014-07-14 MED ORDER — SINCALIDE 5 MCG IJ SOLR
INTRAMUSCULAR | Status: AC
  Administered 2014-07-14: 1.9 ug via INTRAVENOUS
  Filled 2014-07-14: qty 10

## 2014-07-14 MED ORDER — PANTOPRAZOLE SODIUM 40 MG PO TBEC
40.0000 mg | DELAYED_RELEASE_TABLET | Freq: Every day | ORAL | Status: DC
  Administered 2014-07-14 – 2014-07-19 (×6): 40 mg via ORAL
  Filled 2014-07-14 (×5): qty 1

## 2014-07-14 MED ORDER — SINCALIDE 5 MCG IJ SOLR
0.0200 ug/kg | Freq: Once | INTRAMUSCULAR | Status: AC
  Administered 2014-07-14: 1.9 ug via INTRAVENOUS

## 2014-07-14 MED ORDER — LEVALBUTEROL HCL 0.63 MG/3ML IN NEBU
0.6300 mg | INHALATION_SOLUTION | Freq: Four times a day (QID) | RESPIRATORY_TRACT | Status: DC
  Administered 2014-07-14 – 2014-07-15 (×4): 0.63 mg via RESPIRATORY_TRACT
  Filled 2014-07-14 (×8): qty 3

## 2014-07-14 NOTE — Progress Notes (Signed)
Patient ID: [name]  male  ZOX:[id number]    DOB: 12/12/1950    DOA: 07/13/2014  PCP: Lupita Raider, MD   Brief history of present illness  Patient is a 64 year old male with COPD, not on home O2, was in his usual state of health until 2-3 days prior to admission when he started developing pain in the left side of his chest, sharp without any radiation, 4/10 in intensity. Associated with shortness of breath and coughing with clear expectoration. He also noticed some difficulty while lying down. He denied any fevers per se. He noticed some leg swelling. Chest pain increased with coughing and deep breathing. Patient had cold-like symptoms earlier this month and his grandchildren had been sick. Patient also reported for past many months having abdominal bloating and distention, worked up by his gastroenterologist, underwent a colonoscopy on Monday which apparently was unremarkable.  Assessment/Plan: Principal Problem:   CAP (community acquired pneumonia) with pleuritic chest pain - CT angiogram of the chest negative for any PE. Will continue IV Zithromax, Rocephin -  continue Symbicort, Xopenex, prednisone (allergy protocol)  Active Problems:   Chest pain/Pleuritic with dyspnea, peripheral edema  - Troponins negative, ruled out for acute ACS, 2-D echo pending  - Patient feels better after IV Lasix given to him in the ED, -1.9 L, no change in weight  - will follow echocardiogram results      Sinus tachycardia - Placed on metoprolol    abdominal bloating with   Abdominal pain, chronic, right upper quadrant -Patient denies endoscopy, has been having abdominal bloating with abdominal pain after eating for several months. Ultrasound of the abdomen showed no gallstones and no sonographic Murphy sign. CT chest showed minimal stranding noted about the gallbladder with question of mild pericholecystic fluid, question mild cholecystitis. Currently patient has no abdominal pain LFTs are normal.  Ordered HIDA scan.  - Placed on PPI, further workup outpatient   DVT Prophylaxis: Lovenox   Code Status: full code   Family Communication:  Disposition:  Consultants:None  Procedures   none  Antibiotics: IV Zithromax  IV Rocephin   Subjective   Patient seen and examined, feeling somewhat better today, also feels that abdominal bloating is somewhat better after Lasix. However he reports having abdominal pain after eating and bloating for several months    tive: Weight change:   Intake/Output Summary (Last 24 hours) at 07/14/14 1136 Last data filed at 07/14/14 0950  Gross per 24 hour  Intake    960 ml  Output   2900 ml  Net  -1940 ml   Blood pressure 111/83, pulse 121, temperature 97.7 F (36.5 C), temperature source Oral, resp. rate 21, height 5\' 7"  (1.702 m), weight 96.1 kg (211 lb 13.8 oz), SpO2 95 %.  Physical Exam: General: Alert and awake, oriented x3, not in any acute distress. CVS: S1-S2 clear, no murmur rubs or gallops Chest: decreased breath sounds at the bases Abdomen:  Obese, soft nontender, nondistended, normal bowel sounds  Extremities: no cyanosis, clubbing or edema noted bilaterally Neuro: Cranial nerves II-XII intact, no focal neurological deficits  Lab Results: Basic Metabolic Panel:  Recent Labs Lab 07/13/14 0913 07/14/14 0120  NA 136 134*  K 3.9 4.2  CL 101 102  CO2 25 25  GLUCOSE 155* 247*  BUN 9 12  CREATININE 0.95 0.99  CALCIUM 9.0 8.3*   Liver Function Tests:  Recent Labs Lab 07/13/14 0957  AST 19  ALT 19  ALKPHOS 58  BILITOT 2.0*  PROT 7.2  ALBUMIN 3.6    Recent Labs Lab 07/13/14 0957  LIPASE 43   No results for input(s): AMMONIA in the last 168 hours. CBC:  Recent Labs Lab 07/13/14 0923 07/14/14 0120  WBC 8.9 5.0  HGB 16.4 14.8  HCT 48.6 44.9  MCV 93.8 96.8  PLT 228 208   Cardiac Enzymes:  Recent Labs Lab 07/13/14 1417 07/13/14 2017 07/14/14 0120  TROPONINI <0.03 <0.03 <0.03    BNP: Invalid input(s): POCBNP CBG: No results for input(s): GLUCAP in the last 168 hours.   Micro Results: No results found for this or any previous visit (from the past 240 hour(s)).  Studies/Results: Dg Chest 2 View  07/13/2014   CLINICAL DATA:  64 year old male with shortness of breath cough and chest tightness for 2 days. Current history of COPD. Initial encounter.  EXAM: CHEST  2 VIEW  COMPARISON:  CT Abdomen and Pelvis 06/20/2014. Chest radiographs 06/18/2014 and earlier.  FINDINGS: Stable somewhat low lung volumes. Vague increased left mid lung opacity from the prior studies, but at the same time increased lower lobe opacity appears regressed. No pneumothorax. No pleural effusion identified. Stable cardiac size and mediastinal contours. Visualized tracheal air column is within normal limits. Underlying chronic increased interstitial markings. No acute osseous abnormality identified.  IMPRESSION: Vague increased left mid lung opacity compatible with acute pneumonia superimposed on chronic lung disease.  Post treatment radiographs recommended to document resolution.   Electronically Signed   By: Augusto Gamble M.D.   On: 07/13/2014 09:39   Ct Angio Chest Pe W/cm &/or Wo Cm  07/14/2014   CLINICAL DATA:  Chest pain, worsened with breathing or coughing. Shortness of breath. Initial encounter.  EXAM: CT ANGIOGRAPHY CHEST WITH CONTRAST  TECHNIQUE: Multidetector CT imaging of the chest was performed using the standard protocol during bolus administration of intravenous contrast. Multiplanar CT image reconstructions and MIPs were obtained to evaluate the vascular anatomy.  CONTRAST:  OMNIPAQUE IOHEXOL 350 MG/ML SOLN  COMPARISON:  Chest radiograph performed 07/13/2014  FINDINGS: There is no evidence of pulmonary embolus.  Small bilateral pleural effusions are noted, with increased interstitial prominence, likely reflecting mild pulmonary edema. The effusion is slightly loculated on the left side. A  large 1.5 cm calcified granuloma is again noted at the left lung base. There is no evidence of pneumothorax. No masses are identified; no abnormal focal contrast enhancement is seen.  There is minimal aneurysmal dilatation of the ascending thoracic aorta, measuring 4.0 cm in AP dimension, and mild aneurysmal dilatation of the proximal aortic arch, measuring 3.8 cm in diameter. Scattered coronary artery calcifications are seen. No mediastinal lymphadenopathy is appreciated. No pericardial effusion is identified. Mild scattered calcification is noted along the proximal great vessels. No axillary lymphadenopathy is seen. The thyroid gland is diminutive and unremarkable in appearance.  The visualized portions of the liver and spleen are unremarkable. Minimal stranding is noted about the gallbladder, with question of mild pericholecystic fluid. Would correlate for symptoms of mild acute cholecystitis. The visualized portions of the pancreas, adrenal glands and kidneys are grossly unremarkable, aside from mild nonspecific perinephric stranding.  No acute osseous abnormalities are seen.  Review of the MIP images confirms the above findings.  IMPRESSION: 1. No evidence of pulmonary embolus. 2. Small bilateral pleural effusions, with increased interstitial prominence, likely reflecting mild pulmonary edema. The left-sided pleural effusion is slightly loculated. 3. Minimal soft tissue stranding about the gallbladder, with suggestion of mild pericholecystic fluid. Would correlate for symptoms of  mild acute cholecystitis. 4. Minimal aneurysmal dilatation of the ascending thoracic aorta, measuring up to 4.0 cm in AP dimension, and mild aneurysmal dilatation of the proximal aortic arch, measuring 3.8 cm in diameter. Recommend annual imaging followup by CTA or MRA. This recommendation follows 2010 ACCF/AHA/AATS/ACR/ASA/SCA/SCAI/SIR/STS/SVM Guidelines for the Diagnosis and Management of Patients with Thoracic Aortic Disease.  Circulation.2010; 121: [id number] 5. Scattered coronary artery calcifications seen.   Electronically Signed   By: Roanna Raider M.D.   On: 07/14/2014 02:18   US Abdomen Complete  07/13/2014   CLINICAL DATA:  64 year old male with 1 day history of right upper quadrant pain  EXAM: ULTRASOUND ABDOMEN COMPLETE  COMPARISON:  None.  FINDINGS: Gallbladder: No gallstones or wall thickening visualized. No sonographic Murphy sign noted.  Common bile duct: Diameter: Within normal limits at 5 mm  Liver: No focal lesion identified. Within normal limits in parenchymal echogenicity. The main portal vein is patent with normal hepatopetal flow.  IVC: No abnormality visualized.  Pancreas: Visualized portion unremarkable. The tail is largely obscured by overlying bowel gas.  Spleen: Size and appearance within normal limits.  Right Kidney: Length: 13.2 cm. Echogenicity within normal limits. No mass or hydronephrosis visualized.  Left Kidney: Length: 13.6 cm. Echogenicity within normal limits. No mass or hydronephrosis visualized.  Abdominal aorta: Largely obscured by bowel gas. However, recent CTA of the abdomen and pelvis demonstrates a normal caliber aorta.  Other findings:  Small right-sided pleural effusion.  IMPRESSION: 1. No acute abnormality in the abdomen to explain the patient's clinical symptoms. 2. Small right-sided pleural effusion noted incidentally.   Electronically Signed   By: Malachy Moan M.D.   On: 07/13/2014 11:19   Ct Abdomen Pelvis W Contrast  06/20/2014   CLINICAL DATA:  Abdominal pain and bloating for 4-5 months. No history of cancer or surgery. Lower abdominal pain.  EXAM: CT ABDOMEN AND PELVIS WITH CONTRAST  TECHNIQUE: Multidetector CT imaging of the abdomen and pelvis was performed using the standard protocol following bolus administration of intravenous contrast.  CONTRAST:  OMNIPAQUE IOHEXOL 300 MG/ML  SOLN  13 hour prep.  No reaction.  COMPARISON:  04/02/2005  FINDINGS: Lower chest:  Calcified granuloma at the left lung base. Dependent changes at the right lung base. No pericardial effusion.  Upper abdomen: Fatty liver without focal lesion. The gallbladder is present. No focal abnormality identified within the spleen or pancreas. Left renal cyst is 1.7 cm. No hydronephrosis.  Gastrointestinal tract: The stomach and small bowel loops are normal in appearance. The appendix is well seen and has a normal appearance. Colonic diverticula are present.  Pelvis: Urinary bladder and prostate gland have a normal CT appearance. No free pelvic fluid. Fat containing left inguinal hernia.  Retroperitoneum: No retroperitoneal or mesenteric adenopathy.  Abdominal wall: Small fat containing umbilical hernia.  Osseous structures: Degenerative changes in the spine. Visualized osseous structures have a normal appearance.  IMPRESSION: 1. Fatty liver. 2. Left renal cyst. 3. Normal appendix. 4. Colonic diverticulosis. 5. Fat containing left inguinal hernia. 6. Fat containing umbilical hernia.   Electronically Signed   By: Rosalie Gums M.D.   On: 06/20/2014 14:58    Medications: Scheduled Meds: . aspirin EC  81 mg Oral Daily  . azithromycin  500 mg Intravenous Q24H  . budesonide-formoterol  1 puff Inhalation BID  . cefTRIAXone (ROCEPHIN)  IV  1 g Intravenous Q24H  . enoxaparin (LOVENOX) injection  40 mg Subcutaneous Q24H  . levalbuterol  0.63 mg Nebulization Q6H  .  metoprolol tartrate  25 mg Oral BID  . predniSONE  50 mg Oral Once  . sodium chloride  3 mL Intravenous Q12H  . sodium chloride  3 mL Intravenous Q12H      LOS: 1 day   RAI,RIPUDEEP M.D. [facility] 07/14/2014, 11:36 AM Pager: [phone]  If 7PM-7AM, please contact night-coverage [email or web address] Password [email or web address]

## 2014-07-14 NOTE — Care Management Note (Signed)
    Page 1 of 1   07/19/2014     4:46:25 PM CARE MANAGEMENT NOTE 07/19/2014  Patient:  TRESTYN, CHILD   Account Number:  [id number]  Date Initiated:  07/14/2014  Documentation initiated by:  Zavannah Deblois  Subjective/Objective Assessment:   Pt adm on 07/13/14 with CP, SOB, PNA, increased HR.  PTA, pt independent, lives with spouse.     Action/Plan:   Will follow for dc needs as pt progresses.   Anticipated DC Date:  07/19/2014   Anticipated DC Plan:  HOME/SELF CARE      DC Planning Services  CM consult      Choice offered to / List presented to:             Status of service:  Completed, signed off Medicare Important Message given?   (If response is "NO", the following Medicare IM given date fields will be blank) Date Medicare IM given:   Medicare IM given by:   Date Additional Medicare IM given:   Additional Medicare IM given by:    Discharge Disposition:  HOME/SELF CARE  Per UR Regulation:  Reviewed for med. necessity/level of care/duration of stay  If discussed at Long Length of Stay Meetings, dates discussed:   07/19/2014    Comments:  07/19/14 Sidney Ace, RN, BSN [phone] Pt to be fitted for Lifevest today after 5pm, per [employer] rep. Plan dc after Lifevest fitted.  07/18/14 Sidney Ace, RN, BSN [phone] Cath today; EF 15-20% with NICM.  Pt will need Lifevest. Faxed clinicals and initiated Lifevest forms to [employer], per request.

## 2014-07-14 NOTE — Progress Notes (Signed)
Utilization review completed. Susie Ehresman, RN, BSN. 

## 2014-07-15 ENCOUNTER — Encounter (HOSPITAL_COMMUNITY): Payer: Self-pay | Admitting: Physician Assistant

## 2014-07-15 DIAGNOSIS — E1110 Type 2 diabetes mellitus with ketoacidosis without coma: Secondary | ICD-10-CM | POA: Diagnosis present

## 2014-07-15 DIAGNOSIS — E1310 Other specified diabetes mellitus with ketoacidosis without coma: Secondary | ICD-10-CM

## 2014-07-15 DIAGNOSIS — I5021 Acute systolic (congestive) heart failure: Secondary | ICD-10-CM | POA: Diagnosis present

## 2014-07-15 DIAGNOSIS — E119 Type 2 diabetes mellitus without complications: Secondary | ICD-10-CM

## 2014-07-15 DIAGNOSIS — R0600 Dyspnea, unspecified: Secondary | ICD-10-CM

## 2014-07-15 DIAGNOSIS — I5041 Acute combined systolic (congestive) and diastolic (congestive) heart failure: Secondary | ICD-10-CM | POA: Diagnosis present

## 2014-07-15 DIAGNOSIS — J449 Chronic obstructive pulmonary disease, unspecified: Secondary | ICD-10-CM | POA: Diagnosis present

## 2014-07-15 DIAGNOSIS — I5022 Chronic systolic (congestive) heart failure: Secondary | ICD-10-CM | POA: Diagnosis present

## 2014-07-15 DIAGNOSIS — E669 Obesity, unspecified: Secondary | ICD-10-CM | POA: Diagnosis present

## 2014-07-15 LAB — BASIC METABOLIC PANEL
Anion gap: 9 (ref 5–15)
BUN: 12 mg/dL (ref 6–23)
CHLORIDE: 101 mmol/L (ref 96–112)
CO2: 27 mmol/L (ref 19–32)
Calcium: 8.9 mg/dL (ref 8.4–10.5)
Creatinine, Ser: 1.07 mg/dL (ref 0.50–1.35)
GFR calc Af Amer: 83 mL/min — ABNORMAL LOW (ref >90)
GFR calc non Af Amer: 71 mL/min — ABNORMAL LOW (ref >90)
GLUCOSE: 243 mg/dL — AB (ref 70–99)
POTASSIUM: 4.1 mmol/L (ref 3.5–5.1)
Sodium: 137 mmol/L (ref 135–145)

## 2014-07-15 LAB — CBC
HCT: 45.8 % (ref 39.0–52.0)
Hemoglobin: 15 g/dL (ref 13.0–17.0)
MCH: 31.1 pg (ref 26.0–34.0)
MCHC: 32.8 g/dL (ref 30.0–36.0)
MCV: 95 fL (ref 78.0–100.0)
Platelets: 243 10*3/uL (ref 150–400)
RBC: 4.82 MIL/uL (ref 4.22–5.81)
RDW: 14.1 % (ref 11.5–15.5)
WBC: 10.9 10*3/uL — ABNORMAL HIGH (ref 4.0–10.5)

## 2014-07-15 LAB — GLUCOSE, CAPILLARY
GLUCOSE-CAPILLARY: 201 mg/dL — AB (ref 70–99)
Glucose-Capillary: 209 mg/dL — ABNORMAL HIGH (ref 70–99)
Glucose-Capillary: 137 mg/dL — ABNORMAL HIGH (ref 70–99)

## 2014-07-15 LAB — HIV ANTIBODY (ROUTINE TESTING W REFLEX): HIV Screen 4th Generation wRfx: NONREACTIVE

## 2014-07-15 MED ORDER — ATORVASTATIN CALCIUM 40 MG PO TABS
40.0000 mg | ORAL_TABLET | Freq: Every day | ORAL | Status: DC
  Administered 2014-07-15 – 2014-07-18 (×4): 40 mg via ORAL
  Filled 2014-07-15 (×5): qty 1

## 2014-07-15 MED ORDER — INSULIN ASPART 100 UNIT/ML ~~LOC~~ SOLN
0.0000 [IU] | Freq: Three times a day (TID) | SUBCUTANEOUS | Status: DC
Start: 2014-07-15 — End: 2014-07-20
  Administered 2014-07-15 – 2014-07-16 (×3): 3 [IU] via SUBCUTANEOUS
  Administered 2014-07-16 – 2014-07-17 (×2): 1 [IU] via SUBCUTANEOUS
  Administered 2014-07-17 – 2014-07-18 (×3): 2 [IU] via SUBCUTANEOUS
  Administered 2014-07-18 – 2014-07-19 (×2): 1 [IU] via SUBCUTANEOUS
  Administered 2014-07-19: 2 [IU] via SUBCUTANEOUS

## 2014-07-15 MED ORDER — FUROSEMIDE 40 MG PO TABS: 40.0000 mg | ORAL_TABLET | Freq: Every day | ORAL | Status: DC

## 2014-07-15 MED ORDER — ZOLPIDEM TARTRATE 5 MG PO TABS
5.0000 mg | ORAL_TABLET | Freq: Every evening | ORAL | Status: DC | PRN
  Administered 2014-07-15 – 2014-07-18 (×4): 5 mg via ORAL
  Filled 2014-07-15 (×4): qty 1

## 2014-07-15 MED ORDER — AZITHROMYCIN 500 MG PO TABS
500.0000 mg | ORAL_TABLET | Freq: Every day | ORAL | Status: DC
  Administered 2014-07-15 – 2014-07-19 (×5): 500 mg via ORAL
  Filled 2014-07-15 (×5): qty 1

## 2014-07-15 MED ORDER — INSULIN ASPART 100 UNIT/ML ~~LOC~~ SOLN: 0.0000 [IU] | Freq: Every day | SUBCUTANEOUS | Status: DC

## 2014-07-15 MED ORDER — SPIRONOLACTONE 12.5 MG HALF TABLET
12.5000 mg | ORAL_TABLET | Freq: Every day | ORAL | Status: DC
  Administered 2014-07-16 – 2014-07-19 (×4): 12.5 mg via ORAL
  Filled 2014-07-15 (×5): qty 1

## 2014-07-15 MED ORDER — FUROSEMIDE 10 MG/ML IJ SOLN: 20.0000 mg | Freq: Once | INTRAMUSCULAR | Status: DC

## 2014-07-15 MED ORDER — CARVEDILOL 3.125 MG PO TABS
3.1250 mg | ORAL_TABLET | Freq: Two times a day (BID) | ORAL | Status: DC
  Administered 2014-07-16 – 2014-07-19 (×6): 3.125 mg via ORAL
  Filled 2014-07-15 (×9): qty 1

## 2014-07-15 MED ORDER — POLYETHYLENE GLYCOL 3350 17 G PO PACK
17.0000 g | PACK | Freq: Every day | ORAL | Status: DC | PRN
  Filled 2014-07-15: qty 1

## 2014-07-15 MED ORDER — DIGOXIN 250 MCG PO TABS
0.2500 mg | ORAL_TABLET | Freq: Every day | ORAL | Status: DC
  Administered 2014-07-15 – 2014-07-19 (×5): 0.25 mg via ORAL
  Filled 2014-07-15 (×6): qty 1

## 2014-07-15 MED ORDER — FUROSEMIDE 20 MG PO TABS
20.0000 mg | ORAL_TABLET | Freq: Every day | ORAL | Status: DC
  Filled 2014-07-15: qty 1

## 2014-07-15 MED ORDER — FUROSEMIDE 10 MG/ML IJ SOLN
40.0000 mg | Freq: Once | INTRAMUSCULAR | Status: AC
  Administered 2014-07-15: 40 mg via INTRAVENOUS
  Filled 2014-07-15: qty 4

## 2014-07-15 MED ORDER — LIVING WELL WITH DIABETES BOOK
Freq: Once | Status: AC
  Administered 2014-07-15: 13:00:00
  Filled 2014-07-15: qty 1

## 2014-07-15 MED ORDER — SIMETHICONE 80 MG PO CHEW
80.0000 mg | CHEWABLE_TABLET | Freq: Four times a day (QID) | ORAL | Status: DC
  Administered 2014-07-15 – 2014-07-18 (×7): 80 mg via ORAL
  Filled 2014-07-15 (×17): qty 1

## 2014-07-15 MED ORDER — POTASSIUM CHLORIDE ER 10 MEQ PO TBCR
20.0000 meq | EXTENDED_RELEASE_TABLET | Freq: Two times a day (BID) | ORAL | Status: DC
  Administered 2014-07-15 – 2014-07-19 (×8): 20 meq via ORAL
  Filled 2014-07-15 (×9): qty 2

## 2014-07-15 MED ORDER — LISINOPRIL 5 MG PO TABS
5.0000 mg | ORAL_TABLET | Freq: Every day | ORAL | Status: DC
  Administered 2014-07-15 – 2014-07-19 (×3): 5 mg via ORAL
  Filled 2014-07-15 (×5): qty 1

## 2014-07-15 MED ORDER — LEVALBUTEROL HCL 0.63 MG/3ML IN NEBU: 0.6300 mg | INHALATION_SOLUTION | Freq: Four times a day (QID) | RESPIRATORY_TRACT | Status: DC | PRN

## 2014-07-15 MED ORDER — FUROSEMIDE 10 MG/ML IJ SOLN
40.0000 mg | Freq: Two times a day (BID) | INTRAMUSCULAR | Status: DC
  Administered 2014-07-15 – 2014-07-17 (×5): 40 mg via INTRAVENOUS
  Filled 2014-07-15 (×8): qty 4

## 2014-07-15 NOTE — Consult Note (Signed)
CARDIOLOGY CONSULT NOTE   Patient ID: THIERNO HUN MRN: [id number] DOB/AGE: July 29, 1950 64 y.o.  Admit date: 07/13/2014  Primary Physician   Lupita Raider, MD Primary Cardiologist   New Reason for Consultation   CHF  HPI: MONTERIO BOB is a 64 y.o. male with a history of COPD and no prior cardiac history who was admitted to [facility] on 07/15/14 with chest pain and SOB. 2D ECHO revealed EF 15-20% and cardiology was consulted.   The patient notes that he started noting sx of abdominal discomfort and distension last summer. He has seen Dr. Clelia Croft about this and was referred to Dr. Randa Evens for a colonoscopy. He underwent the colonoscopy on 07/11/14 at which time he was noted to be tachycardic w/ HRs in 150s.The study returned unremarkable. Earlier in the month he also had fevers, chills, diarrhea and cough. He was seen in a walk in clinic and diagnosed with bronchitis and treated with ABx. Since that time he has noted chest pain worse with coughing. Over the past couple days the chest pain has worsened. It is in the left side of his chest, sharp without any radiation, 4/10 in intensity. Associated with shortness of breath and coughing with clear expectoration. Chest pain increased with coughing and deep breathing. He also noticed some orthopnea, PND and mild LE edema. Due to his chest pain and difficulty sleeping he presented to the [facility] ED.He undewent CXR which revealed a possible L PNA. 2D ECHO revealed moderate to severe LV dilatation with severely decreased LVEF 15-20% with diffuse hypokinesis. Restrictive pattern of diastolic dysfunction with elevated filling pressures. Moderate mitral regurgitation. Dilated RV with moderately decaresed systolic function.  Moderate pulmonary hypertension with RVSP 52 mmHg. He was also diagnosed with new onset DM. He has a family history of DM but no CAD in any family members.   Today he is feeling much better after PO Lasix. He still has some residual chest pain  especially when coughing.   Past Medical History  Diagnosis Date  . COPD (chronic obstructive pulmonary disease)      History reviewed. No pertinent past surgical history.  Allergies  Allergen Reactions  . Iodine Solution [Povidone Iodine] Rash    I have reviewed the patient's current medications . aspirin EC  81 mg Oral Daily  . azithromycin  500 mg Oral Daily  . budesonide-formoterol  1 puff Inhalation BID  . cefTRIAXone (ROCEPHIN)  IV  1 g Intravenous Q24H  . enoxaparin (LOVENOX) injection  40 mg Subcutaneous Q24H  . [START ON 07/16/2014] furosemide  40 mg Oral Daily  . insulin aspart  0-5 Units Subcutaneous QHS  . insulin aspart  0-9 Units Subcutaneous TID WC  . levalbuterol  0.63 mg Nebulization Q6H  . lisinopril  5 mg Oral Daily  . living well with diabetes book   Does not apply Once  . metoprolol tartrate  25 mg Oral BID  . pantoprazole  40 mg Oral Q0600  . simethicone  80 mg Oral QID  . sodium chloride  3 mL Intravenous Q12H     acetaminophen **OR** acetaminophen, morphine injection, ondansetron **OR** ondansetron (ZOFRAN) IV, oxyCODONE, polyethylene glycol  Prior to Admission medications   Medication Sig Start Date End Date Taking? Authorizing Provider  albuterol (PROVENTIL HFA;VENTOLIN HFA) 108 (90 BASE) MCG/ACT inhaler Inhale 1 puff into the lungs every 6 (six) hours as needed for wheezing or shortness of breath (sometimes allergy).   Yes Historical Provider, MD  budesonide-formoterol (SYMBICORT) 160-4.5  MCG/ACT inhaler Inhale 1 puff into the lungs 2 (two) times daily.   Yes Historical Provider, MD     History   Social History  . Marital Status: Married    Spouse Name: N/A    Number of Children: N/A  . Years of Education: N/A   Occupational History  . Not on file.   Social History Main Topics  . Smoking status: Former Smoker    Start date: 10/24/2010  . Smokeless tobacco: Not on file  . Alcohol Use: Yes  . Drug Use: No  . Sexual Activity: Not on file    Other Topics Concern  . Not on file   Social History Narrative    Family Status  Relation Status Death Age  . Father Deceased     parkinsons   Family History  Problem Relation Age of Onset  . Diabetes Father      ROS:  Full 14 point review of systems complete and found to be negative unless listed above.  Physical Exam: Blood pressure 104/64, pulse 115, temperature 97.5 F (36.4 C), temperature source Oral, resp. rate 20, height 5\' 7"  (1.702 m), weight 213 lb 13.5 oz (97 kg), SpO2 97 %.  General: Well developed, well nourished, male in no acute distress. Obese Head: Eyes PERRLA, No xanthomas.   Normocephalic and atraumatic, oropharynx without edema or exudate.  Poor dentition Lungs: decreased breath sounds at bases  Heart: tachycardic. Unable to feel PMI. No s3 heard Neck: No carotid bruits. No lymphadenopathy. JVP hard to see looks like up to jaw Abdomen: Bowel sounds present, abdomen soft and non-tender without masses or hernias noted. + mild Abdominal distension Msk:  No spine or cva tenderness. No weakness, no joint deformities or effusions. Extremities: No clubbing or cyanosis. 2+  edema.  Neuro: Alert and oriented X 3. No focal deficits noted. Psych:  Good affect, responds appropriately Skin: No rashes or lesions noted.  Labs:   Lab Results  Component Value Date   WBC 10.9* 07/15/2014   HGB 15.0 07/15/2014   HCT 45.8 07/15/2014   MCV 95.0 07/15/2014   PLT 243 07/15/2014   No results for input(s): INR in the last 72 hours.  Recent Labs Lab 07/13/14 0957  07/15/14 0446  NA  --   < > 137  K  --   < > 4.1  CL  --   < > 101  CO2  --   < > 27  BUN  --   < > 12  CREATININE  --   < > 1.07  CALCIUM  --   < > 8.9  PROT 7.2  --   --   BILITOT 2.0*  --   --   ALKPHOS 58  --   --   ALT 19  --   --   AST 19  --   --   GLUCOSE  --   < > 243*  ALBUMIN 3.6  --   --   < > = values in this interval not displayed. No results found for: MG  Recent Labs   07/13/14 1417 07/13/14 2017 07/14/14 0120  TROPONINI <0.03 <0.03 <0.03    Recent Labs  07/13/14 0929  TROPIPOC 0.02    LIPASE  Date/Time Value Ref Range Status  07/13/2014 09:57 AM 43 11 - 59 U/L Final   TSH  Date/Time Value Ref Range Status  07/13/2014 06:35 PM 1.088 0.350 - 4.500 uIU/mL Final     Echo: Study Date:  07/15/2014 LV EF: 20% -   25% Study Conclusions - Left ventricle: The cavity size was moderately dilated. Systolic   function was severely reduced. The estimated ejection fraction   was in the range of 15% to 20%. Diffuse hypokinesis. Doppler   parameters are consistent with a restrictive pattern, indicative   of decreased left ventricular diastolic compliance and/or   increased left atrial pressure (grade 3 diastolic dysfunction).   Doppler parameters are consistent with elevated ventricular   end-diastolic filling pressure. - Aortic valve: Trileaflet; normal thickness leaflets. There was no   regurgitation. - Mitral valve: Significant mitral annular calcifications,   predominantly posterior. Mobility was mildly restricted. There   was moderate regurgitation directed centrally. - Left atrium: The atrium was moderately dilated. - Right ventricle: The cavity size was mildly dilated. Wall   thickness was normal. Systolic function was moderately reduced. - Tricuspid valve: There was mild regurgitation. - Pulmonary arteries: Systolic pressure was moderately increased.   PA peak pressure: 52 mm Hg (S). - Pericardium, extracardiac: There was no pericardial effusion. Impressions: - Moderate to severe LV dilatation with severely decreased LVEF 15-20% with diffuse hypokinesis. Restrictive pattern of diastolic dysfunction with elevated filling pressures. Moderate mitral regurgitation. Dilated RV with moderately decaresed systolic function.  Moderate pulmonary hypertension with RVSP 52 mmHg.   ECG:  HR 130 Sinus tachycardia with Fusion complexes Left axis  deviation  Radiology:  Ct Angio Chest Pe W/cm &/or Wo Cm  07/14/2014   CLINICAL DATA:  Chest pain, worsened with breathing or coughing. Shortness of breath. Initial encounter.  EXAM: CT ANGIOGRAPHY CHEST WITH CONTRAST  TECHNIQUE: Multidetector CT imaging of the chest was performed using the standard protocol during bolus administration of intravenous contrast. Multiplanar CT image reconstructions and MIPs were obtained to evaluate the vascular anatomy.  CONTRAST:  OMNIPAQUE IOHEXOL 350 MG/ML SOLN  COMPARISON:  Chest radiograph performed 07/13/2014  FINDINGS: There is no evidence of pulmonary embolus.  Small bilateral pleural effusions are noted, with increased interstitial prominence, likely reflecting mild pulmonary edema. The effusion is slightly loculated on the left side. A large 1.5 cm calcified granuloma is again noted at the left lung base. There is no evidence of pneumothorax. No masses are identified; no abnormal focal contrast enhancement is seen.  There is minimal aneurysmal dilatation of the ascending thoracic aorta, measuring 4.0 cm in AP dimension, and mild aneurysmal dilatation of the proximal aortic arch, measuring 3.8 cm in diameter. Scattered coronary artery calcifications are seen. No mediastinal lymphadenopathy is appreciated. No pericardial effusion is identified. Mild scattered calcification is noted along the proximal great vessels. No axillary lymphadenopathy is seen. The thyroid gland is diminutive and unremarkable in appearance.  The visualized portions of the liver and spleen are unremarkable. Minimal stranding is noted about the gallbladder, with question of mild pericholecystic fluid. Would correlate for symptoms of mild acute cholecystitis. The visualized portions of the pancreas, adrenal glands and kidneys are grossly unremarkable, aside from mild nonspecific perinephric stranding.  No acute osseous abnormalities are seen.  Review of the MIP images confirms the above findings.   IMPRESSION: 1. No evidence of pulmonary embolus. 2. Small bilateral pleural effusions, with increased interstitial prominence, likely reflecting mild pulmonary edema. The left-sided pleural effusion is slightly loculated. 3. Minimal soft tissue stranding about the gallbladder, with suggestion of mild pericholecystic fluid. Would correlate for symptoms of mild acute cholecystitis. 4. Minimal aneurysmal dilatation of the ascending thoracic aorta, measuring up to 4.0 cm in AP dimension, and mild  aneurysmal dilatation of the proximal aortic arch, measuring 3.8 cm in diameter. Recommend annual imaging followup by CTA or MRA. This recommendation follows 2010 ACCF/AHA/AATS/ACR/ASA/SCA/SCAI/SIR/STS/SVM Guidelines for the Diagnosis and Management of Patients with Thoracic Aortic Disease. Circulation.2010; 121: e266-e369 5. Scattered coronary artery calcifications seen.   Electronically Signed   By: Roanna Raider M.D.   On: 07/14/2014 02:18   Nm Hepatobiliary Including Gb  07/14/2014   CLINICAL DATA:  Bloating and abdominal pressure sensation, suspect mild cholecystitis  EXAM: NUCLEAR MEDICINE HEPATOBILIARY IMAGING WITH GALLBLADDER EF  TECHNIQUE: Sequential images of the abdomen were obtained out to 60 minutes following intravenous administration of radiopharmaceutical. After slow intravenous infusion of 1.9 micrograms Cholecystokinin, gallbladder ejection fraction was determined.  RADIOPHARMACEUTICALS:  1.0 Millicurie Tc-26m Choletec  COMPARISON:  Abdominal ultrasound dated July 13, 2014  FINDINGS: There is adequate uptake of the radiopharmaceutical by the liver. The gallbladder is visualized by 20 min. Bowel activity is also evident by 15-20 min. The 30 min gallbladder ejection fraction is 79%. At 30 min, normal ejection fraction is greater than 30%.  The patient did not experience symptoms during CCK infusion.  IMPRESSION: Normal hepatobiliary scan and normal gallbladder ejection fraction.   Electronically Signed    By: David  [place]   On: 07/14/2014 13:59    ASSESSMENT AND PLAN:    Principal Problem:   Acute combined systolic and diastolic CHF, NYHA class 3 Active Problems:   CAP (community acquired pneumonia)   Chest pain   Sinus tachycardia   Dyspnea   Abdominal pain, chronic, right upper quadrant   DM (diabetes mellitus) type 2, uncontrolled, with ketoacidosis   COPD (chronic obstructive pulmonary disease)   Obesity   Diabetes mellitus, type II   Chronic systolic CHF (congestive heart failure)   CHASTEN BLAZE is a 65 y.o. male with a history of COPD and no prior cardiac history who was admitted to [facility] on 07/15/14 with chest pain and SOB. 2D ECHO revealed EF 15-20% and cardiology was consulted.   Acute combined systolic and diastolic CHF- -- 2d ECHO today with moderate to severe LV dilatation with severely decreased LVEF 15-20% with diffuse hypokinesis. Restrictive pattern of diastolic dysfunction with elevated filling pressures. Moderate mitral regurgitation. Dilated RV with moderately decaresed systolic function. Moderate pulmonary hypertension with RVSP 52 mmHg. -- BNP 877, CXR w/ possible PNA but no pulm edema. -- Continue BB and ACEi.  -- Given IV lasix and now transitioned to po Lasix. He is feeling much better after diuresis. Net neg 620 ml, weight actually up. Not sure his dry weight. May need RHC  -- Troponin negative and ECG stable.  May need R/LHC with new cardiomyopathy to rule out ischemia and measure pressures.   CAP (community acquired pneumonia) with pleuritic chest pain -- CT angiogram of the chest negative for any PE.  -- Continue Abx per IM  Sinus tachycardia -- CTA neg for PE  Abdominal bloating with abdominal pain, chronic, right upper quadrant -- There could be a CHF component to this.  -- Ultrasound of the abdomen showed no gallstones and no sonographic Murphy sign. CT chest showed minimal stranding noted about the gallbladder with question of mild pericholecystic  fluid, question mild cholecystitis. Currently patient has no abdominal pain LFTs are normal. HIDA scan scan normal/negative.   Diabetes Mellitus- newly diagnosed on this admission  -- Per IM  COPD- per IM   Signed: Janetta Hora, PA-C 07/15/2014 3:16 PM  Pager [phone]  Co-Sign MD  Patient  seen and examined with Carlean Jews, PA-C. We discussed all aspects of the encounter. I agree with the assessment and plan as stated above.  He has new onset HF. History concerning for ischemic CM however only scattered CAD. Will plan R/L heart cath on Monday. Suspect ab symptoms related to HF. Still with volume overload. Will continue to diurese and adjust HF meds. Tachycardia concerning for low output - would be very careful with b-blocker. Start digoxin.   HF team will follow.   Truman Hayward 6:31 PM

## 2014-07-15 NOTE — Progress Notes (Signed)
  Echocardiogram 2D Echocardiogram has been performed.  Arvil Chaco 07/15/2014, 1:53 PM

## 2014-07-15 NOTE — Progress Notes (Addendum)
Inpatient Diabetes Program Recommendations  AACE/ADA: New Consensus Statement on Inpatient Glycemic Control (2013)  Target Ranges:  Prepandial:   less than 140 mg/dL      Peak postprandial:   less than 180 mg/dL (1-2 hours)      Critically ill patients:  140 - 180 mg/dL   Reason for Assessment: Diabetes Coordinator consult-- new onset diabetes  Diabetes history: None Outpatient Diabetes medications: N/A Current orders for Inpatient glycemic control: Novolog sensitive correction scale tid, HS scale  Results for [name], [name] (MRN [id number]) as of 07/15/2014 10:20  Ref. Range 07/13/2014 09:13 07/14/2014 01:20 07/15/2014 04:46  Glucose Latest Range: 70-99 mg/dL 035 (H) 009 (H) 381 (H)   Note: Received Prednisone 50 mg time three.  Last dose 1/28 at 1400.  Effects of prednisone still impacting glucose.  A1C ordered stat, but results not available yet.  Awaiting A1C results before making full recommendations.  Thank you.  Reinhold Rickey S. Elsie Lincoln, RN, CNS, CDE Inpatient Diabetes Program, team pager [phone]

## 2014-07-15 NOTE — Progress Notes (Addendum)
Patient ID: [name]  male  ZOX:[id number]    DOB: 27-Dec-1950    DOA: 07/13/2014  PCP: Lupita Raider, MD   Brief history of present illness  Patient is a 64 year old male with COPD, not on home O2, was in his usual state of health until 2-3 days prior to admission when he started developing pain in the left side of his chest, sharp without any radiation, 4/10 in intensity. Associated with shortness of breath and coughing with clear expectoration. He also noticed some difficulty while lying down. He denied any fevers per se. He noticed some leg swelling. Chest pain increased with coughing and deep breathing. Patient had cold-like symptoms earlier this month and his grandchildren had been sick. Patient also reported for past many months having abdominal bloating and distention, worked up by his gastroenterologist, underwent a colonoscopy on Monday which apparently was unremarkable.  Assessment/Plan: Principal Problem: Acute combined systolic and diastolic CHF, moderate mitral regurgitation, chest pain - 2-D echo showed EF of 15-20% with diffuse hypokinesis, grade 3 diastolic dysfunction - Discussing this in detail with the patient, he reported that he had been getting fatigued since October of last year, with dyspnea on exertion, abdominal bloating, may have been related to CHF rather than GI (patient has been going through GI workup outpatient which has been negative so far). Due to chest pain and his symptoms improved with Lasix, hence 2-D echo was ordered. -  Continue aspirin, beta blocker, added lisinopril 5 mg daily, continue IV Lasix, strict I's and O's and daily weights - Cardiology consult called, also briefly discussed with Dr. Gala Romney, may need cardiac cath to rule out ischemic cardiomyopathy - CT angiogram was negative for PE    CAP (community acquired pneumonia) with pleuritic chest pain - CT angiogram of the chest negative for any PE. Will continue IV Zithromax, Rocephin -   continue Symbicort, Xopenex, prednisone (allergy protocol)     Sinus tachycardia - Placed on metoprolol    abdominal bloating with  Abdominal pain, chronic, right upper quadrant -Patient denies endoscopy, has been having abdominal bloating with abdominal pain after eating for several months. Ultrasound of the abdomen showed no gallstones and no sonographic Murphy sign. CT chest showed minimal stranding noted about the gallbladder with question of mild pericholecystic fluid, question mild cholecystitis. Currently patient has no abdominal pain LFTs are normal - HIDA scan scan normal/negative.  - Continue PPI, simethicone  Type 2 diabetes mellitus: New diagnosis. The patient reports that he had been checking his blood sugar at home and were running in 200s however he was not on any hypoglycemics. He was working on his diet and weight control. - Obtain hemoglobin A1c placed on sliding scale insulin, diabetic education - Placed on Januvia  DVT Prophylaxis: Lovenox   Code Status: full code   Family Communication: Patient alert and oriented, discussed in detail  Disposition:  Consultants: Cardiology  Procedures   2-D echo  Antibiotics: IV Zithromax  IV Rocephin   Subjective   Patient seen and examined, reports that abdominal bloating and shortness of breath improved after Lasix. Has chronic constipation issues but did have BM yesterday. No nausea, vomiting, chest pain.  Objective Weight change: 1.291 kg (2 lb 13.5 oz)  Intake/Output Summary (Last 24 hours) at 07/15/14 1448 Last data filed at 07/15/14 1441  Gross per 24 hour  Intake   1320 ml  Output      0 ml  Net   1320 ml   Blood pressure 104/64,  pulse 115, temperature 97.5 F (36.4 C), temperature source Oral, resp. rate 20, height  (1.702 m), weight 97 kg (213 lb 13.5 oz), SpO2 97 %.  Physical Exam: General: Alert and awake, oriented x3, NAD CVS: S1-S2 clear Chest: decreased breath sounds at the bases Abdomen:   Obese, soft nontender, NT, NBS Extremities: no cyanosis, clubbing or edema noted bilaterally Neuro: Cranial nerves II-XII intact, no focal neurological deficits  Lab Results: Basic Metabolic Panel:  Recent Labs Lab 07/14/14 0120 07/15/14 0446  NA 134* 137  K 4.2 4.1  CL 102 101  CO2 25 27  GLUCOSE 247* 243*  BUN 12 12  CREATININE 0.99 1.07  CALCIUM 8.3* 8.9   Liver Function Tests:  Recent Labs Lab 07/13/14 0957  AST 19  ALT 19  ALKPHOS 58  BILITOT 2.0*  PROT 7.2  ALBUMIN 3.6    Recent Labs Lab 07/13/14 0957  LIPASE 43   No results for input(s): AMMONIA in the last 168 hours. CBC:  Recent Labs Lab 07/14/14 0120 07/15/14 0446  WBC 5.0 10.9*  HGB 14.8 15.0  HCT 44.9 45.8  MCV 96.8 95.0  PLT 208 243   Cardiac Enzymes:  Recent Labs Lab 07/13/14 1417 07/13/14 2017 07/14/14 0120  TROPONINI <0.03 <0.03 <0.03   BNP: Invalid input(s): POCBNP CBG:  Recent Labs Lab 07/15/14 1222  GLUCAP 201*     Micro Results: No results found for this or any previous visit (from the past 240 hour(s)).  Studies/Results: Dg Chest 2 View  07/13/2014   CLINICAL DATA:  64 year old male with shortness of breath cough and chest tightness for 2 days. Current history of COPD. Initial encounter.  EXAM: CHEST  2 VIEW  COMPARISON:  CT Abdomen and Pelvis 06/20/2014. Chest radiographs 06/18/2014 and earlier.  FINDINGS: Stable somewhat low lung volumes. Vague increased left mid lung opacity from the prior studies, but at the same time increased lower lobe opacity appears regressed. No pneumothorax. No pleural effusion identified. Stable cardiac size and mediastinal contours. Visualized tracheal air column is within normal limits. Underlying chronic increased interstitial markings. No acute osseous abnormality identified.  IMPRESSION: Vague increased left mid lung opacity compatible with acute pneumonia superimposed on chronic lung disease.  Post treatment radiographs recommended to  document resolution.   Electronically Signed   By: Augusto Gamble M.D.   On: 07/13/2014 09:39   Ct Angio Chest Pe W/cm &/or Wo Cm  07/14/2014   CLINICAL DATA:  Chest pain, worsened with breathing or coughing. Shortness of breath. Initial encounter.  EXAM: CT ANGIOGRAPHY CHEST WITH CONTRAST  TECHNIQUE: Multidetector CT imaging of the chest was performed using the standard protocol during bolus administration of intravenous contrast. Multiplanar CT image reconstructions and MIPs were obtained to evaluate the vascular anatomy.  CONTRAST:  OMNIPAQUE IOHEXOL 350 MG/ML SOLN  COMPARISON:  Chest radiograph performed 07/13/2014  FINDINGS: There is no evidence of pulmonary embolus.  Small bilateral pleural effusions are noted, with increased interstitial prominence, likely reflecting mild pulmonary edema. The effusion is slightly loculated on the left side. A large 1.5 cm calcified granuloma is again noted at the left lung base. There is no evidence of pneumothorax. No masses are identified; no abnormal focal contrast enhancement is seen.  There is minimal aneurysmal dilatation of the ascending thoracic aorta, measuring 4.0 cm in AP dimension, and mild aneurysmal dilatation of the proximal aortic arch, measuring 3.8 cm in diameter. Scattered coronary artery calcifications are seen. No mediastinal lymphadenopathy is appreciated. No pericardial  effusion is identified. Mild scattered calcification is noted along the proximal great vessels. No axillary lymphadenopathy is seen. The thyroid gland is diminutive and unremarkable in appearance.  The visualized portions of the liver and spleen are unremarkable. Minimal stranding is noted about the gallbladder, with question of mild pericholecystic fluid. Would correlate for symptoms of mild acute cholecystitis. The visualized portions of the pancreas, adrenal glands and kidneys are grossly unremarkable, aside from mild nonspecific perinephric stranding.  No acute osseous  abnormalities are seen.  Review of the MIP images confirms the above findings.  IMPRESSION: 1. No evidence of pulmonary embolus. 2. Small bilateral pleural effusions, with increased interstitial prominence, likely reflecting mild pulmonary edema. The left-sided pleural effusion is slightly loculated. 3. Minimal soft tissue stranding about the gallbladder, with suggestion of mild pericholecystic fluid. Would correlate for symptoms of mild acute cholecystitis. 4. Minimal aneurysmal dilatation of the ascending thoracic aorta, measuring up to 4.0 cm in AP dimension, and mild aneurysmal dilatation of the proximal aortic arch, measuring 3.8 cm in diameter. Recommend annual imaging followup by CTA or MRA. This recommendation follows 2010 ACCF/AHA/AATS/ACR/ASA/SCA/SCAI/SIR/STS/SVM Guidelines for the Diagnosis and Management of Patients with Thoracic Aortic Disease. Circulation.2010; 121: e266-e369 5. Scattered coronary artery calcifications seen.   Electronically Signed   By: Roanna Raider M.D.   On: 07/14/2014 02:18   Nm Hepatobiliary Including Gb  07/14/2014   CLINICAL DATA:  Bloating and abdominal pressure sensation, suspect mild cholecystitis  EXAM: NUCLEAR MEDICINE HEPATOBILIARY IMAGING WITH GALLBLADDER EF  TECHNIQUE: Sequential images of the abdomen were obtained out to 60 minutes following intravenous administration of radiopharmaceutical. After slow intravenous infusion of 1.9 micrograms Cholecystokinin, gallbladder ejection fraction was determined.  RADIOPHARMACEUTICALS:  1.0 Millicurie Tc-55m Choletec  COMPARISON:  Abdominal ultrasound dated July 13, 2014  FINDINGS: There is adequate uptake of the radiopharmaceutical by the liver. The gallbladder is visualized by 20 min. Bowel activity is also evident by 15-20 min. The 30 min gallbladder ejection fraction is 79%. At 30 min, normal ejection fraction is greater than 30%.  The patient did not experience symptoms during CCK infusion.  IMPRESSION: Normal  hepatobiliary scan and normal gallbladder ejection fraction.   Electronically Signed   By: David  [place]   On: 07/14/2014 13:59   US Abdomen Complete  07/13/2014   CLINICAL DATA:  64 year old male with 1 day history of right upper quadrant pain  EXAM: ULTRASOUND ABDOMEN COMPLETE  COMPARISON:  None.  FINDINGS: Gallbladder: No gallstones or wall thickening visualized. No sonographic Murphy sign noted.  Common bile duct: Diameter: Within normal limits at 5 mm  Liver: No focal lesion identified. Within normal limits in parenchymal echogenicity. The main portal vein is patent with normal hepatopetal flow.  IVC: No abnormality visualized.  Pancreas: Visualized portion unremarkable. The tail is largely obscured by overlying bowel gas.  Spleen: Size and appearance within normal limits.  Right Kidney: Length: 13.2 cm. Echogenicity within normal limits. No mass or hydronephrosis visualized.  Left Kidney: Length: 13.6 cm. Echogenicity within normal limits. No mass or hydronephrosis visualized.  Abdominal aorta: Largely obscured by bowel gas. However, recent CTA of the abdomen and pelvis demonstrates a normal caliber aorta.  Other findings:  Small right-sided pleural effusion.  IMPRESSION: 1. No acute abnormality in the abdomen to explain the patient's clinical symptoms. 2. Small right-sided pleural effusion noted incidentally.   Electronically Signed   By: Malachy Moan M.D.   On: 07/13/2014 11:19   Ct Abdomen Pelvis W Contrast  06/20/2014  CLINICAL DATA:  Abdominal pain and bloating for 4-5 months. No history of cancer or surgery. Lower abdominal pain.  EXAM: CT ABDOMEN AND PELVIS WITH CONTRAST  TECHNIQUE: Multidetector CT imaging of the abdomen and pelvis was performed using the standard protocol following bolus administration of intravenous contrast.  CONTRAST:  OMNIPAQUE IOHEXOL 300 MG/ML  SOLN  13 hour prep.  No reaction.  COMPARISON:  04/02/2005  FINDINGS: Lower chest: Calcified granuloma at the left lung  base. Dependent changes at the right lung base. No pericardial effusion.  Upper abdomen: Fatty liver without focal lesion. The gallbladder is present. No focal abnormality identified within the spleen or pancreas. Left renal cyst is 1.7 cm. No hydronephrosis.  Gastrointestinal tract: The stomach and small bowel loops are normal in appearance. The appendix is well seen and has a normal appearance. Colonic diverticula are present.  Pelvis: Urinary bladder and prostate gland have a normal CT appearance. No free pelvic fluid. Fat containing left inguinal hernia.  Retroperitoneum: No retroperitoneal or mesenteric adenopathy.  Abdominal wall: Small fat containing umbilical hernia.  Osseous structures: Degenerative changes in the spine. Visualized osseous structures have a normal appearance.  IMPRESSION: 1. Fatty liver. 2. Left renal cyst. 3. Normal appendix. 4. Colonic diverticulosis. 5. Fat containing left inguinal hernia. 6. Fat containing umbilical hernia.   Electronically Signed   By: Rosalie Gums M.D.   On: 06/20/2014 14:58    Medications: Scheduled Meds: . aspirin EC  81 mg Oral Daily  . azithromycin  500 mg Oral Daily  . budesonide-formoterol  1 puff Inhalation BID  . cefTRIAXone (ROCEPHIN)  IV  1 g Intravenous Q24H  . enoxaparin (LOVENOX) injection  40 mg Subcutaneous Q24H  . [START ON 07/16/2014] furosemide  40 mg Oral Daily  . insulin aspart  0-5 Units Subcutaneous QHS  . insulin aspart  0-9 Units Subcutaneous TID WC  . levalbuterol  0.63 mg Nebulization Q6H  . living well with diabetes book   Does not apply Once  . metoprolol tartrate  25 mg Oral BID  . pantoprazole  40 mg Oral Q0600  . simethicone  80 mg Oral QID  . sodium chloride  3 mL Intravenous Q12H   Time spent 25 minutes   LOS: 2 days   Nikash Mortensen M.D. [facility] 07/15/2014, 2:48 PM Pager: [phone]  If 7PM-7AM, please contact night-coverage [email or web address] Password [email or web address]

## 2014-07-15 NOTE — Progress Notes (Signed)
  RD consulted for nutrition education regarding diabetes.   No results found for: HGBA1C  RD reviewed patient's dietary recall. Pt reported eating 2-3 meals daily, all of which were less than 4 servings of carbohydrate. Pt reports avoiding sweets/sugar and eating vegetables, fruits, and lean meats. Pt reports being frustrated with his medical condition and not knowing exactly what is wrong with him. He declined any further diet education at this time.  RD provided "Carbohydrate Counting for People with Diabetes" handout from the Academy of Nutrition and Dietetics. Provided list of carbohydrates and recommended serving sizes of common foods. Encouraged pt to find heart healthy recipes at www.heart.org  Body mass index is 33.49 kg/(m^2). Pt meets criteria for Obesity based on current BMI.  Current diet order is Carb Modified, patient is consuming approximately 100% of meals at this time. Labs and medications reviewed. No further nutrition interventions warranted at this time. RD contact information provided. If additional nutrition issues arise, please re-consult RD.  Ian Malkin RD, LDN Inpatient Clinical Dietitian Pager: [phone] After Hours Pager: [phone]

## 2014-07-15 NOTE — Progress Notes (Signed)
Inpatient Diabetes Program Recommendations  AACE/ADA: New Consensus Statement on Inpatient Glycemic Control (2013)  Target Ranges:  Prepandial:   less than 140 mg/dL      Peak postprandial:   less than 180 mg/dL (1-2 hours)      Critically ill patients:  140 - 180 mg/dL   Reason for Visit: Consult from MD regarding new diagnosis of diabetes  Diabetes history: None, but PCP had asked patient to check CBG's at home due to a glucose elevation presumably in the office.   Outpatient Diabetes medications: None  Current orders for Inpatient glycemic control: Novolog sensitive correction scale tid with meals and HS correction scale at HS  Note:  Had attempted to speak with patient around 1PM, but 2-D Echo had to be done.  Spoke with Dr. Isidoro Donning around [facility] and plan was for patient to be discharged this afternoon if cardiac testing was OK.  Returned to speak with patient around 2:45PM regarding diabetes.  Cardiology PA came and discussed findings regarding CHF with patient and need for him to remain in hospital until his CHF stabilizes.   Did speak with patient in general regarding some aspects of diabetes, meal planning, etc but patient probably not able to comprehend much given just finding out about heart problems.  He agreed that is has been rather overwhelmed today with bad news.  Gave him the Living with Diabetes booklet.  Had already given him a simple general guide to eating in case dietitian unable to see him before discharge.  Will now defer completion of dietary guidelines to dietitian.    Patient states that he is not willing to go for outpatient diabetes education.  States that his PCP wanted to send him to cooking class and his insurance won't cover it and he can't afford it.  States he is capable of reading books, looking up information on the internet, etc and prefers to be educated this way.  Student nurse present.  She will help him access the Patient Education Channel to view the videos  regarding CHF and Diabetes.  Returned to room and gave patient the website for the American Diabetes Association, www.diabetes.org, as a reliable source for diabetes education.  Informed him of a free program he can access for free diabetes materials for one year.    Agree with plan to start a DPP-4 inhibitor.  Still awaiting A1C.  Patient does not know name of his glucose meter.  Wife will find out and inform MD.    At discharge will need: Rx for glucose strips and follow-up with PCP as soon as possible.  Thank you.  Zienna Ahlin S. Elsie Lincoln, RN, CNS, CDE Inpatient Diabetes Program, team pager [phone]

## 2014-07-16 DIAGNOSIS — I5041 Acute combined systolic (congestive) and diastolic (congestive) heart failure: Principal | ICD-10-CM

## 2014-07-16 DIAGNOSIS — I5022 Chronic systolic (congestive) heart failure: Secondary | ICD-10-CM

## 2014-07-16 LAB — BASIC METABOLIC PANEL
Anion gap: 7 (ref 5–15)
BUN: 15 mg/dL (ref 6–23)
CHLORIDE: 94 mmol/L — AB (ref 96–112)
CO2: 36 mmol/L — ABNORMAL HIGH (ref 19–32)
Calcium: 8.7 mg/dL (ref 8.4–10.5)
Creatinine, Ser: 1.29 mg/dL (ref 0.50–1.35)
GFR calc non Af Amer: 57 mL/min — ABNORMAL LOW (ref >90)
GFR, EST AFRICAN AMERICAN: 66 mL/min — AB (ref >90)
Glucose, Bld: 130 mg/dL — ABNORMAL HIGH (ref 70–99)
POTASSIUM: 3.6 mmol/L (ref 3.5–5.1)
Sodium: 137 mmol/L (ref 135–145)

## 2014-07-16 LAB — LIPID PANEL
Cholesterol: 151 mg/dL (ref 0–200)
HDL: 33 mg/dL — ABNORMAL LOW (ref >39)
LDL Cholesterol: 97 mg/dL (ref 0–99)
Total CHOL/HDL Ratio: 4.6 RATIO
Triglycerides: 103 mg/dL (ref <150)
VLDL: 21 mg/dL (ref 0–40)

## 2014-07-16 LAB — GLUCOSE, CAPILLARY
GLUCOSE-CAPILLARY: 106 mg/dL — AB (ref 70–99)
Glucose-Capillary: 134 mg/dL — ABNORMAL HIGH (ref 70–99)
Glucose-Capillary: 207 mg/dL — ABNORMAL HIGH (ref 70–99)
Glucose-Capillary: 164 mg/dL — ABNORMAL HIGH (ref 70–99)

## 2014-07-16 LAB — HEMOGLOBIN A1C
Hgb A1c MFr Bld: 9.5 % — ABNORMAL HIGH (ref 4.8–5.6)
MEAN PLASMA GLUCOSE: 226 mg/dL

## 2014-07-16 NOTE — Progress Notes (Signed)
Patient Name: [name] Date of Encounter: 07/16/2014  Principal Problem:   Acute combined systolic and diastolic CHF, NYHA class 3 Active Problems:   CAP (community acquired pneumonia)   Chest pain   Sinus tachycardia   Dyspnea   Abdominal pain, chronic, right upper quadrant   DM (diabetes mellitus) type 2, uncontrolled, with ketoacidosis   COPD (chronic obstructive pulmonary disease)   Obesity   Diabetes mellitus, type II   Chronic systolic CHF (congestive heart failure)   Length of Stay: 3  SUBJECTIVE  Substantially better after just 1L diuresis. Still has clinical evidence of hypervolemia. BP "soft" but asymptomatic.  CURRENT MEDS . aspirin EC  81 mg Oral Daily  . atorvastatin  40 mg Oral q1800  . azithromycin  500 mg Oral Daily  . budesonide-formoterol  1 puff Inhalation BID  . carvedilol  3.125 mg Oral BID WC  . cefTRIAXone (ROCEPHIN)  IV  1 g Intravenous Q24H  . digoxin  0.25 mg Oral Daily  . enoxaparin (LOVENOX) injection  40 mg Subcutaneous Q24H  . furosemide  40 mg Intravenous BID  . insulin aspart  0-5 Units Subcutaneous QHS  . insulin aspart  0-9 Units Subcutaneous TID WC  . lisinopril  5 mg Oral Daily  . pantoprazole  40 mg Oral Q0600  . potassium chloride  20 mEq Oral BID  . simethicone  80 mg Oral QID  . sodium chloride  3 mL Intravenous Q12H  . spironolactone  12.5 mg Oral Daily    OBJECTIVE   Intake/Output Summary (Last 24 hours) at 07/16/14 1116 Last data filed at 07/16/14 0908  Gross per 24 hour  Intake   1620 ml  Output   1800 ml  Net   -180 ml   Filed Weights   07/14/14 0622 07/15/14 0412 07/16/14 0533  Weight: 211 lb 13.8 oz (96.1 kg) 213 lb 13.5 oz (97 kg) 207 lb 6.4 oz (94.076 kg)    PHYSICAL EXAM Filed Vitals:   07/15/14 1938 07/15/14 2141 07/16/14 0533 07/16/14 0852  BP:   Pulse:  106 119   Temp:  97.5 F (36.4 C) 98.4 F (36.9 C)   TempSrc:  Oral Oral   Resp:  18 20   Height:      Weight:   207 lb  6.4 oz (94.076 kg)   SpO2: 97% 96% 96%    General: Alert, oriented x3, no distress Head: no evidence of trauma, PERRL, EOMI, no exophtalmos or lid lag, no myxedema, no xanthelasma; normal ears, nose and oropharynx Neck: 7-8 cm elevation in jugular venous pulsations and no hepatojugular reflux; brisk carotid pulses without delay and no carotid bruits Chest: clear to auscultation, no signs of consolidation by percussion or palpation, normal fremitus, symmetrical and full respiratory excursions Cardiovascular: laterally displaced and diffuse apical impulse, regular rhythm, normal first and second heart sounds, no rubs or gallops, no murmur Abdomen: no tenderness or distention, no masses by palpation, no abnormal pulsatility or arterial bruits, normal bowel sounds, no hepatosplenomegaly Extremities: no clubbing, cyanosis; 1+ symmetrical pedal/ankle edema; 2+ radial, ulnar and brachial pulses bilaterally; 2+ right femoral, posterior tibial and dorsalis pedis pulses; 2+ left femoral, posterior tibial and dorsalis pedis pulses; no subclavian or femoral bruits Neurological: grossly nonfocal  LABS  CBC  Recent Labs  07/14/14 0120 07/15/14 0446  WBC 5.0 10.9*  HGB 14.8 15.0  HCT 44.9 45.8  MCV 96.8 95.0  PLT 208 243   Basic Metabolic Panel  Recent Labs  07/15/14 0446 07/16/14 0637  NA 137 137  K 4.1 3.6  CL 101 94*  CO2 27 36*  GLUCOSE 243* 130*  BUN 12 15  CREATININE 1.07 1.29  CALCIUM 8.9 8.7   Liver Function Tests No results for input(s): AST, ALT, ALKPHOS, BILITOT, PROT, ALBUMIN in the last 72 hours. No results for input(s): LIPASE, AMYLASE in the last 72 hours. Cardiac Enzymes  Recent Labs  07/13/14 1417 07/13/14 2017 07/14/14 0120  TROPONINI <0.03 <0.03 <0.03   BNP Invalid input(s): POCBNP D-Dimer No results for input(s): DDIMER in the last 72 hours. Hemoglobin A1C  Recent Labs  07/15/14 0736  HGBA1C 9.5*   Fasting Lipid Panel  Recent Labs   07/16/14 0637  CHOL 151  HDL 33*  LDLCALC 97  TRIG 088  CHOLHDL 4.6   Thyroid Function Tests  Recent Labs  07/13/14 1835  TSH 1.088    Radiology Studies Imaging results have been reviewed and Nm Hepatobiliary Including Gb  07/14/2014   CLINICAL DATA:  Bloating and abdominal pressure sensation, suspect mild cholecystitis  EXAM: NUCLEAR MEDICINE HEPATOBILIARY IMAGING WITH GALLBLADDER EF  TECHNIQUE: Sequential images of the abdomen were obtained out to 60 minutes following intravenous administration of radiopharmaceutical. After slow intravenous infusion of 1.9 micrograms Cholecystokinin, gallbladder ejection fraction was determined.  RADIOPHARMACEUTICALS:  1.0 Millicurie Tc-52m Choletec  COMPARISON:  Abdominal ultrasound dated July 13, 2014  FINDINGS: There is adequate uptake of the radiopharmaceutical by the liver. The gallbladder is visualized by 20 min. Bowel activity is also evident by 15-20 min. The 30 min gallbladder ejection fraction is 79%. At 30 min, normal ejection fraction is greater than 30%.  The patient did not experience symptoms during CCK infusion.  IMPRESSION: Normal hepatobiliary scan and normal gallbladder ejection fraction.   Electronically Signed   By: David  [place]   On: 07/14/2014 13:59    TELE NSR, PVCs  ECG NSR, very frequent monomorphic PVCs  ASSESSMENT AND PLAN  Newly recognized dilated cardiomyopathy, likely longstanding, now with acute heart failure. Etiology uncertain, intermediate risk for CAD, no angina. A little calcium seen in coronaries on CT chest, but in distal vessels. +DM, not controlled. Recommend right and left heart cath Monday, but suspect nonischemic cardiomyopathy. Needs additional diuresis. Limited ability to use CHF meds due to low BP. If we have to choose, favor use of beta blockers over ACEi since there is a chance he has PVC related cardiomyopathy.   Thurmon Fair, MD, [facility] [phone] office [phone]  pager 07/16/2014 11:16 AM

## 2014-07-16 NOTE — Progress Notes (Signed)
Patient ID: [name]  male  WUJ:[id number]    DOB: 04/06/1951    DOA: 07/13/2014  PCP: Lupita Raider, MD   Brief history of present illness  Patient is a 64 year old male with COPD, not on home O2, was in his usual state of health until 2-3 days prior to admission when he started developing pain in the left side of his chest, sharp without any radiation, 4/10 in intensity. Associated with shortness of breath and coughing with clear expectoration. He also noticed some difficulty while lying down. He denied any fevers per se. He noticed some leg swelling. Chest pain increased with coughing and deep breathing. Patient had cold-like symptoms earlier this month and his grandchildren had been sick. Patient also reported for past many months having abdominal bloating and distention, worked up by his gastroenterologist, underwent a colonoscopy on Monday which apparently was unremarkable.  Assessment/Plan: Principal Problem: Acute combined systolic and diastolic CHF, moderate mitral regurgitation, chest pain - 2-D echo showed EF of 15-20% with diffuse hypokinesis, grade 3 diastolic dysfunction - Discussing this in detail with the patient, he reported that he had been getting fatigued since October of last year, with dyspnea on exertion, abdominal bloating, may have been related to CHF rather than GI (patient has been going through GI workup outpatient which has been negative so far). -  Continue aspirin, beta blocker, lisinopril 5 mg daily, continue IV Lasix, negative balance of 1.0 L, weight down from 213 -> 207 lbs - Highly appreciate CHF team/Dr. Bensimhon's recommendations, pending cardiac cath on Monday - CT angiogram was negative for PE    CAP (community acquired pneumonia) with pleuritic chest pain - CT angiogram of the chest negative for any PE, continue IV Zithromax, Rocephin -  continue Symbicort, Xopenex, prednisone (allergy protocol)     Sinus tachycardia - Placed on metoprolol    abdominal bloating with  Abdominal pain, chronic, right upper quadrant -Patient denies endoscopy, has been having abdominal bloating with abdominal pain after eating for several months. Ultrasound of the abdomen showed no gallstones and no sonographic Murphy sign. CT chest showed minimal stranding noted about the gallbladder with question of mild pericholecystic fluid, question mild cholecystitis. Currently patient has no abdominal pain LFTs are normal - HIDA scan scan normal/negative.  - Continue PPI, simethicone  Type 2 diabetes mellitus: New diagnosis. The patient reports that he had been checking his blood sugar at home and were running in 200s however he was not on any hypoglycemics. He was working on his diet and weight control. -  A1c 9.5, continue sliding scale insulin, diabetic education - Placed on Januvia  DVT Prophylaxis: Lovenox   Code Status: full code   Family Communication: Patient alert and oriented, discussed in detail  Disposition:  Consultants: Cardiology  Procedures   2-D echo  Antibiotics: IV Zithromax  IV Rocephin   Subjective   Patient seen and examined, feels a whole lot better today, no chest pain or shortness of breath, abdominal bloating and peripheral edema improving  Objective Weight change: -2.924 kg (-6 lb 7.1 oz)  Intake/Output Summary (Last 24 hours) at 07/16/14 1028 Last data filed at 07/16/14 0908  Gross per 24 hour  Intake   1620 ml  Output   1800 ml  Net   -180 ml   Blood pressure 90/52, pulse 119, temperature 98.4 F (36.9 C), temperature source Oral, resp. rate 20, height  (1.702 m), weight 94.076 kg (207 lb 6.4 oz), SpO2 96 %.  Physical  Exam: General: Alert and awake, oriented x3, NAD CVS: S1-S2 clear Chest: decreased breath sounds at the bases Abdomen:  Obese, soft nontender, NT, NBS Extremities: no cyanosis, clubbing or edema noted bilaterally Neuro: Cranial nerves II-XII intact, no focal neurological deficits  Lab  Results: Basic Metabolic Panel:  Recent Labs Lab 07/15/14 0446 07/16/14 0637  NA 137 137  K 4.1 3.6  CL 101 94*  CO2 27 36*  GLUCOSE 243* 130*  BUN 12 15  CREATININE 1.07 1.29  CALCIUM 8.9 8.7   Liver Function Tests:  Recent Labs Lab 07/13/14 0957  AST 19  ALT 19  ALKPHOS 58  BILITOT 2.0*  PROT 7.2  ALBUMIN 3.6    Recent Labs Lab 07/13/14 0957  LIPASE 43   No results for input(s): AMMONIA in the last 168 hours. CBC:  Recent Labs Lab 07/14/14 0120 07/15/14 0446  WBC 5.0 10.9*  HGB 14.8 15.0  HCT 44.9 45.8  MCV 96.8 95.0  PLT 208 243   Cardiac Enzymes:  Recent Labs Lab 07/13/14 1417 07/13/14 2017 07/14/14 0120  TROPONINI <0.03 <0.03 <0.03   BNP: Invalid input(s): POCBNP CBG:  Recent Labs Lab 07/15/14 1222 07/15/14 1613 07/15/14 2140 07/16/14 0553  GLUCAP 201* 209* 137* 134*     Micro Results: No results found for this or any previous visit (from the past 240 hour(s)).  Studies/Results: Dg Chest 2 View  07/13/2014   CLINICAL DATA:  64 year old male with shortness of breath cough and chest tightness for 2 days. Current history of COPD. Initial encounter.  EXAM: CHEST  2 VIEW  COMPARISON:  CT Abdomen and Pelvis 06/20/2014. Chest radiographs 06/18/2014 and earlier.  FINDINGS: Stable somewhat low lung volumes. Vague increased left mid lung opacity from the prior studies, but at the same time increased lower lobe opacity appears regressed. No pneumothorax. No pleural effusion identified. Stable cardiac size and mediastinal contours. Visualized tracheal air column is within normal limits. Underlying chronic increased interstitial markings. No acute osseous abnormality identified.  IMPRESSION: Vague increased left mid lung opacity compatible with acute pneumonia superimposed on chronic lung disease.  Post treatment radiographs recommended to document resolution.   Electronically Signed   By: Augusto Gamble M.D.   On: 07/13/2014 09:39   Ct Angio Chest Pe  W/cm &/or Wo Cm  07/14/2014   CLINICAL DATA:  Chest pain, worsened with breathing or coughing. Shortness of breath. Initial encounter.  EXAM: CT ANGIOGRAPHY CHEST WITH CONTRAST  TECHNIQUE: Multidetector CT imaging of the chest was performed using the standard protocol during bolus administration of intravenous contrast. Multiplanar CT image reconstructions and MIPs were obtained to evaluate the vascular anatomy.  CONTRAST:  OMNIPAQUE IOHEXOL 350 MG/ML SOLN  COMPARISON:  Chest radiograph performed 07/13/2014  FINDINGS: There is no evidence of pulmonary embolus.  Small bilateral pleural effusions are noted, with increased interstitial prominence, likely reflecting mild pulmonary edema. The effusion is slightly loculated on the left side. A large 1.5 cm calcified granuloma is again noted at the left lung base. There is no evidence of pneumothorax. No masses are identified; no abnormal focal contrast enhancement is seen.  There is minimal aneurysmal dilatation of the ascending thoracic aorta, measuring 4.0 cm in AP dimension, and mild aneurysmal dilatation of the proximal aortic arch, measuring 3.8 cm in diameter. Scattered coronary artery calcifications are seen. No mediastinal lymphadenopathy is appreciated. No pericardial effusion is identified. Mild scattered calcification is noted along the proximal great vessels. No axillary lymphadenopathy is seen. The thyroid gland  is diminutive and unremarkable in appearance.  The visualized portions of the liver and spleen are unremarkable. Minimal stranding is noted about the gallbladder, with question of mild pericholecystic fluid. Would correlate for symptoms of mild acute cholecystitis. The visualized portions of the pancreas, adrenal glands and kidneys are grossly unremarkable, aside from mild nonspecific perinephric stranding.  No acute osseous abnormalities are seen.  Review of the MIP images confirms the above findings.  IMPRESSION: 1. No evidence of pulmonary  embolus. 2. Small bilateral pleural effusions, with increased interstitial prominence, likely reflecting mild pulmonary edema. The left-sided pleural effusion is slightly loculated. 3. Minimal soft tissue stranding about the gallbladder, with suggestion of mild pericholecystic fluid. Would correlate for symptoms of mild acute cholecystitis. 4. Minimal aneurysmal dilatation of the ascending thoracic aorta, measuring up to 4.0 cm in AP dimension, and mild aneurysmal dilatation of the proximal aortic arch, measuring 3.8 cm in diameter. Recommend annual imaging followup by CTA or MRA. This recommendation follows 2010 ACCF/AHA/AATS/ACR/ASA/SCA/SCAI/SIR/STS/SVM Guidelines for the Diagnosis and Management of Patients with Thoracic Aortic Disease. Circulation.2010; 121: e266-e369 5. Scattered coronary artery calcifications seen.   Electronically Signed   By: Roanna Raider M.D.   On: 07/14/2014 02:18   Nm Hepatobiliary Including Gb  07/14/2014   CLINICAL DATA:  Bloating and abdominal pressure sensation, suspect mild cholecystitis  EXAM: NUCLEAR MEDICINE HEPATOBILIARY IMAGING WITH GALLBLADDER EF  TECHNIQUE: Sequential images of the abdomen were obtained out to 60 minutes following intravenous administration of radiopharmaceutical. After slow intravenous infusion of 1.9 micrograms Cholecystokinin, gallbladder ejection fraction was determined.  RADIOPHARMACEUTICALS:  1.0 Millicurie Tc-29m Choletec  COMPARISON:  Abdominal ultrasound dated July 13, 2014  FINDINGS: There is adequate uptake of the radiopharmaceutical by the liver. The gallbladder is visualized by 20 min. Bowel activity is also evident by 15-20 min. The 30 min gallbladder ejection fraction is 79%. At 30 min, normal ejection fraction is greater than 30%.  The patient did not experience symptoms during CCK infusion.  IMPRESSION: Normal hepatobiliary scan and normal gallbladder ejection fraction.   Electronically Signed   By: David  [place]   On: 07/14/2014 13:59    US Abdomen Complete  07/13/2014   CLINICAL DATA:  64 year old male with 1 day history of right upper quadrant pain  EXAM: ULTRASOUND ABDOMEN COMPLETE  COMPARISON:  None.  FINDINGS: Gallbladder: No gallstones or wall thickening visualized. No sonographic Murphy sign noted.  Common bile duct: Diameter: Within normal limits at 5 mm  Liver: No focal lesion identified. Within normal limits in parenchymal echogenicity. The main portal vein is patent with normal hepatopetal flow.  IVC: No abnormality visualized.  Pancreas: Visualized portion unremarkable. The tail is largely obscured by overlying bowel gas.  Spleen: Size and appearance within normal limits.  Right Kidney: Length: 13.2 cm. Echogenicity within normal limits. No mass or hydronephrosis visualized.  Left Kidney: Length: 13.6 cm. Echogenicity within normal limits. No mass or hydronephrosis visualized.  Abdominal aorta: Largely obscured by bowel gas. However, recent CTA of the abdomen and pelvis demonstrates a normal caliber aorta.  Other findings:  Small right-sided pleural effusion.  IMPRESSION: 1. No acute abnormality in the abdomen to explain the patient's clinical symptoms. 2. Small right-sided pleural effusion noted incidentally.   Electronically Signed   By: Malachy Moan M.D.   On: 07/13/2014 11:19   Ct Abdomen Pelvis W Contrast  06/20/2014   CLINICAL DATA:  Abdominal pain and bloating for 4-5 months. No history of cancer or surgery. Lower abdominal pain.  EXAM:  CT ABDOMEN AND PELVIS WITH CONTRAST  TECHNIQUE: Multidetector CT imaging of the abdomen and pelvis was performed using the standard protocol following bolus administration of intravenous contrast.  CONTRAST:  OMNIPAQUE IOHEXOL 300 MG/ML  SOLN  13 hour prep.  No reaction.  COMPARISON:  04/02/2005  FINDINGS: Lower chest: Calcified granuloma at the left lung base. Dependent changes at the right lung base. No pericardial effusion.  Upper abdomen: Fatty liver without focal lesion. The  gallbladder is present. No focal abnormality identified within the spleen or pancreas. Left renal cyst is 1.7 cm. No hydronephrosis.  Gastrointestinal tract: The stomach and small bowel loops are normal in appearance. The appendix is well seen and has a normal appearance. Colonic diverticula are present.  Pelvis: Urinary bladder and prostate gland have a normal CT appearance. No free pelvic fluid. Fat containing left inguinal hernia.  Retroperitoneum: No retroperitoneal or mesenteric adenopathy.  Abdominal wall: Small fat containing umbilical hernia.  Osseous structures: Degenerative changes in the spine. Visualized osseous structures have a normal appearance.  IMPRESSION: 1. Fatty liver. 2. Left renal cyst. 3. Normal appendix. 4. Colonic diverticulosis. 5. Fat containing left inguinal hernia. 6. Fat containing umbilical hernia.   Electronically Signed   By: Rosalie Gums M.D.   On: 06/20/2014 14:58    Medications: Scheduled Meds: . aspirin EC  81 mg Oral Daily  . atorvastatin  40 mg Oral q1800  . azithromycin  500 mg Oral Daily  . budesonide-formoterol  1 puff Inhalation BID  . carvedilol  3.125 mg Oral BID WC  . cefTRIAXone (ROCEPHIN)  IV  1 g Intravenous Q24H  . digoxin  0.25 mg Oral Daily  . enoxaparin (LOVENOX) injection  40 mg Subcutaneous Q24H  . furosemide  40 mg Intravenous BID  . insulin aspart  0-5 Units Subcutaneous QHS  . insulin aspart  0-9 Units Subcutaneous TID WC  . lisinopril  5 mg Oral Daily  . pantoprazole  40 mg Oral Q0600  . potassium chloride  20 mEq Oral BID  . simethicone  80 mg Oral QID  . sodium chloride  3 mL Intravenous Q12H  . spironolactone  12.5 mg Oral Daily   Time spent 25 minutes   LOS: 3 days   Tahmid Stonehocker M.D. [facility] 07/16/2014, 10:28 AM Pager: [phone]  If 7PM-7AM, please contact night-coverage [email or web address] Password [email or web address]

## 2014-07-16 NOTE — Progress Notes (Signed)
Per MD hold all BP meds, except for lasix, dig, and aldactone, due to low BP

## 2014-07-17 DIAGNOSIS — I493 Ventricular premature depolarization: Secondary | ICD-10-CM

## 2014-07-17 DIAGNOSIS — I5021 Acute systolic (congestive) heart failure: Secondary | ICD-10-CM

## 2014-07-17 LAB — BASIC METABOLIC PANEL
ANION GAP: 5 (ref 5–15)
BUN: 19 mg/dL (ref 6–23)
CO2: 36 mmol/L — ABNORMAL HIGH (ref 19–32)
CREATININE: 1.21 mg/dL (ref 0.50–1.35)
Calcium: 8.8 mg/dL (ref 8.4–10.5)
Chloride: 95 mmol/L — ABNORMAL LOW (ref 96–112)
GFR calc Af Amer: 71 mL/min — ABNORMAL LOW (ref >90)
GFR calc non Af Amer: 62 mL/min — ABNORMAL LOW (ref >90)
Glucose, Bld: 145 mg/dL — ABNORMAL HIGH (ref 70–99)
Potassium: 3.8 mmol/L (ref 3.5–5.1)
SODIUM: 136 mmol/L (ref 135–145)

## 2014-07-17 LAB — GLUCOSE, CAPILLARY
GLUCOSE-CAPILLARY: 197 mg/dL — AB (ref 70–99)
GLUCOSE-CAPILLARY: 154 mg/dL — AB (ref 70–99)
Glucose-Capillary: 146 mg/dL — ABNORMAL HIGH (ref 70–99)
Glucose-Capillary: 182 mg/dL — ABNORMAL HIGH (ref 70–99)

## 2014-07-17 NOTE — Progress Notes (Signed)
Patient ID: [name]  male  XNA:[id number]    DOB: 1950/08/13    DOA: 07/13/2014  PCP: Lupita Raider, MD   Brief history of present illness  Patient is a 64 year old male with COPD, not on home O2, was in his usual state of health until 2-3 days prior to admission when he started developing pain in the left side of his chest, sharp without any radiation, 4/10 in intensity. Associated with shortness of breath and coughing with clear expectoration. He also noticed some difficulty while lying down. He denied any fevers per se. He noticed some leg swelling. Chest pain increased with coughing and deep breathing. Patient had cold-like symptoms earlier this month and his grandchildren had been sick. Patient also reported for past many months having abdominal bloating and distention, worked up by his gastroenterologist, underwent a colonoscopy on Monday which apparently was unremarkable.  Assessment/Plan: Principal Problem: Acute combined systolic and diastolic CHF, moderate mitral regurgitation, chest pain - 2-D echo showed EF of 15-20% with diffuse hypokinesis, grade 3 diastolic dysfunction - Discussing this in detail with the patient, he reported that he had been getting fatigued since October of last year, with dyspnea on exertion, abdominal bloating, may have been related to CHF rather than GI (patient has been going through GI workup outpatient which has been negative so far). -  Continue aspirin. Currently lisinopril, spironolactone, beta blocker on hold due to hypotension, - On IV Lasix, plan for cardiac cath tomorrow    CAP (community acquired pneumonia) with pleuritic chest pain - CT angiogram of the chest negative for any PE, continue IV Zithromax, Rocephin -  continue Symbicort, Xopenex, prednisone (allergy protocol)     Sinus tachycardia - Placed on metoprolol    abdominal bloating with  Abdominal pain, chronic, right upper quadrant -Patient denies endoscopy, has been having  abdominal bloating with abdominal pain after eating for several months. Ultrasound of the abdomen showed no gallstones and no sonographic Murphy sign. CT chest showed minimal stranding noted about the gallbladder with question of mild pericholecystic fluid, question mild cholecystitis. Currently patient has no abdominal pain LFTs are normal - HIDA scan scan normal/negative.  - Continue PPI, simethicone  Type 2 diabetes mellitus: New diagnosis. The patient reports that he had been checking his blood sugar at home and were running in 200s however he was not on any hypoglycemics. He was working on his diet and weight control. -  A1c 9.5, continue sliding scale insulin, diabetic education - Placed on Januvia  DVT Prophylaxis: Lovenox   Code Status: full code   Family Communication:  D/w patient's wife, Steward Drone in detail at the bedside  Disposition:  Consultants: Cardiology  Procedures   2-D echo  Antibiotics: IV Zithromax  IV Rocephin   Subjective   complaints, overall feeling better, no abdominal bloating, shortness of breath improving  Objective Weight change: -2.994 kg (-6 lb 9.6 oz)  Intake/Output Summary (Last 24 hours) at 07/17/14 1035 Last data filed at 07/17/14 1004  Gross per 24 hour  Intake   1310 ml  Output   4300 ml  Net  -2990 ml   Blood pressure 96/69, pulse 99, temperature 97.7 F (36.5 C), temperature source Oral, resp. rate 18, height 5\' 7"  (1.702 m), weight 91.082 kg (200 lb 12.8 oz), SpO2 98 %.  Physical Exam: General: Alert and awake, oriented x3, NAD CVS: S1-S2 clear Chest: decreased breath sounds at the bases Abdomen:  Obese, soft nontender, NT, NBS Extremities: no cyanosis, clubbing or  edema noted bilaterally   Lab Results: Basic Metabolic Panel:  Recent Labs Lab 07/16/14 0637 07/17/14 0321  NA 137 136  K 3.6 3.8  CL 94* 95*  CO2 36* 36*  GLUCOSE 130* 145*  BUN 15 19  CREATININE 1.29 1.21  CALCIUM 8.7 8.8   Liver Function  Tests:  Recent Labs Lab 07/13/14 0957  AST 19  ALT 19  ALKPHOS 58  BILITOT 2.0*  PROT 7.2  ALBUMIN 3.6    Recent Labs Lab 07/13/14 0957  LIPASE 43   No results for input(s): AMMONIA in the last 168 hours. CBC:  Recent Labs Lab 07/14/14 0120 07/15/14 0446  WBC 5.0 10.9*  HGB 14.8 15.0  HCT 44.9 45.8  MCV 96.8 95.0  PLT 208 243   Cardiac Enzymes:  Recent Labs Lab 07/13/14 1417 07/13/14 2017 07/14/14 0120  TROPONINI <0.03 <0.03 <0.03   BNP: Invalid input(s): POCBNP CBG:  Recent Labs Lab 07/16/14 0553 07/16/14 1151 07/16/14 1632 07/16/14 2138 07/17/14 0609  GLUCAP 134* 207* 106* 164* 146*     Micro Results: No results found for this or any previous visit (from the past 240 hour(s)).  Studies/Results: Dg Chest 2 View  07/13/2014   CLINICAL DATA:  64 year old male with shortness of breath cough and chest tightness for 2 days. Current history of COPD. Initial encounter.  EXAM: CHEST  2 VIEW  COMPARISON:  CT Abdomen and Pelvis 06/20/2014. Chest radiographs 06/18/2014 and earlier.  FINDINGS: Stable somewhat low lung volumes. Vague increased left mid lung opacity from the prior studies, but at the same time increased lower lobe opacity appears regressed. No pneumothorax. No pleural effusion identified. Stable cardiac size and mediastinal contours. Visualized tracheal air column is within normal limits. Underlying chronic increased interstitial markings. No acute osseous abnormality identified.  IMPRESSION: Vague increased left mid lung opacity compatible with acute pneumonia superimposed on chronic lung disease.  Post treatment radiographs recommended to document resolution.   Electronically Signed   By: Augusto Gamble M.D.   On: 07/13/2014 09:39   Ct Angio Chest Pe W/cm &/or Wo Cm  07/14/2014   CLINICAL DATA:  Chest pain, worsened with breathing or coughing. Shortness of breath. Initial encounter.  EXAM: CT ANGIOGRAPHY CHEST WITH CONTRAST  TECHNIQUE: Multidetector CT  imaging of the chest was performed using the standard protocol during bolus administration of intravenous contrast. Multiplanar CT image reconstructions and MIPs were obtained to evaluate the vascular anatomy.  CONTRAST:  OMNIPAQUE IOHEXOL 350 MG/ML SOLN  COMPARISON:  Chest radiograph performed 07/13/2014  FINDINGS: There is no evidence of pulmonary embolus.  Small bilateral pleural effusions are noted, with increased interstitial prominence, likely reflecting mild pulmonary edema. The effusion is slightly loculated on the left side. A large 1.5 cm calcified granuloma is again noted at the left lung base. There is no evidence of pneumothorax. No masses are identified; no abnormal focal contrast enhancement is seen.  There is minimal aneurysmal dilatation of the ascending thoracic aorta, measuring 4.0 cm in AP dimension, and mild aneurysmal dilatation of the proximal aortic arch, measuring 3.8 cm in diameter. Scattered coronary artery calcifications are seen. No mediastinal lymphadenopathy is appreciated. No pericardial effusion is identified. Mild scattered calcification is noted along the proximal great vessels. No axillary lymphadenopathy is seen. The thyroid gland is diminutive and unremarkable in appearance.  The visualized portions of the liver and spleen are unremarkable. Minimal stranding is noted about the gallbladder, with question of mild pericholecystic fluid. Would correlate for symptoms of  mild acute cholecystitis. The visualized portions of the pancreas, adrenal glands and kidneys are grossly unremarkable, aside from mild nonspecific perinephric stranding.  No acute osseous abnormalities are seen.  Review of the MIP images confirms the above findings.  IMPRESSION: 1. No evidence of pulmonary embolus. 2. Small bilateral pleural effusions, with increased interstitial prominence, likely reflecting mild pulmonary edema. The left-sided pleural effusion is slightly loculated. 3. Minimal soft tissue  stranding about the gallbladder, with suggestion of mild pericholecystic fluid. Would correlate for symptoms of mild acute cholecystitis. 4. Minimal aneurysmal dilatation of the ascending thoracic aorta, measuring up to 4.0 cm in AP dimension, and mild aneurysmal dilatation of the proximal aortic arch, measuring 3.8 cm in diameter. Recommend annual imaging followup by CTA or MRA. This recommendation follows 2010 ACCF/AHA/AATS/ACR/ASA/SCA/SCAI/SIR/STS/SVM Guidelines for the Diagnosis and Management of Patients with Thoracic Aortic Disease. Circulation.2010; 121: e266-e369 5. Scattered coronary artery calcifications seen.   Electronically Signed   By: Roanna Raider M.D.   On: 07/14/2014 02:18   Nm Hepatobiliary Including Gb  07/14/2014   CLINICAL DATA:  Bloating and abdominal pressure sensation, suspect mild cholecystitis  EXAM: NUCLEAR MEDICINE HEPATOBILIARY IMAGING WITH GALLBLADDER EF  TECHNIQUE: Sequential images of the abdomen were obtained out to 60 minutes following intravenous administration of radiopharmaceutical. After slow intravenous infusion of 1.9 micrograms Cholecystokinin, gallbladder ejection fraction was determined.  RADIOPHARMACEUTICALS:  1.0 Millicurie Tc-70m Choletec  COMPARISON:  Abdominal ultrasound dated July 13, 2014  FINDINGS: There is adequate uptake of the radiopharmaceutical by the liver. The gallbladder is visualized by 20 min. Bowel activity is also evident by 15-20 min. The 30 min gallbladder ejection fraction is 79%. At 30 min, normal ejection fraction is greater than 30%.  The patient did not experience symptoms during CCK infusion.  IMPRESSION: Normal hepatobiliary scan and normal gallbladder ejection fraction.   Electronically Signed   By: David  [place]   On: 07/14/2014 13:59   US Abdomen Complete  07/13/2014   CLINICAL DATA:  64 year old male with 1 day history of right upper quadrant pain  EXAM: ULTRASOUND ABDOMEN COMPLETE  COMPARISON:  None.  FINDINGS: Gallbladder: No  gallstones or wall thickening visualized. No sonographic Murphy sign noted.  Common bile duct: Diameter: Within normal limits at 5 mm  Liver: No focal lesion identified. Within normal limits in parenchymal echogenicity. The main portal vein is patent with normal hepatopetal flow.  IVC: No abnormality visualized.  Pancreas: Visualized portion unremarkable. The tail is largely obscured by overlying bowel gas.  Spleen: Size and appearance within normal limits.  Right Kidney: Length: 13.2 cm. Echogenicity within normal limits. No mass or hydronephrosis visualized.  Left Kidney: Length: 13.6 cm. Echogenicity within normal limits. No mass or hydronephrosis visualized.  Abdominal aorta: Largely obscured by bowel gas. However, recent CTA of the abdomen and pelvis demonstrates a normal caliber aorta.  Other findings:  Small right-sided pleural effusion.  IMPRESSION: 1. No acute abnormality in the abdomen to explain the patient's clinical symptoms. 2. Small right-sided pleural effusion noted incidentally.   Electronically Signed   By: Malachy Moan M.D.   On: 07/13/2014 11:19   Ct Abdomen Pelvis W Contrast  06/20/2014   CLINICAL DATA:  Abdominal pain and bloating for 4-5 months. No history of cancer or surgery. Lower abdominal pain.  EXAM: CT ABDOMEN AND PELVIS WITH CONTRAST  TECHNIQUE: Multidetector CT imaging of the abdomen and pelvis was performed using the standard protocol following bolus administration of intravenous contrast.  CONTRAST:  OMNIPAQUE IOHEXOL 300  MG/ML  SOLN  13 hour prep.  No reaction.  COMPARISON:  04/02/2005  FINDINGS: Lower chest: Calcified granuloma at the left lung base. Dependent changes at the right lung base. No pericardial effusion.  Upper abdomen: Fatty liver without focal lesion. The gallbladder is present. No focal abnormality identified within the spleen or pancreas. Left renal cyst is 1.7 cm. No hydronephrosis.  Gastrointestinal tract: The stomach and small bowel loops are normal  in appearance. The appendix is well seen and has a normal appearance. Colonic diverticula are present.  Pelvis: Urinary bladder and prostate gland have a normal CT appearance. No free pelvic fluid. Fat containing left inguinal hernia.  Retroperitoneum: No retroperitoneal or mesenteric adenopathy.  Abdominal wall: Small fat containing umbilical hernia.  Osseous structures: Degenerative changes in the spine. Visualized osseous structures have a normal appearance.  IMPRESSION: 1. Fatty liver. 2. Left renal cyst. 3. Normal appendix. 4. Colonic diverticulosis. 5. Fat containing left inguinal hernia. 6. Fat containing umbilical hernia.   Electronically Signed   By: Rosalie Gums M.D.   On: 06/20/2014 14:58    Medications: Scheduled Meds: . aspirin EC  81 mg Oral Daily  . atorvastatin  40 mg Oral q1800  . azithromycin  500 mg Oral Daily  . budesonide-formoterol  1 puff Inhalation BID  . carvedilol  3.125 mg Oral BID WC  . cefTRIAXone (ROCEPHIN)  IV  1 g Intravenous Q24H  . digoxin  0.25 mg Oral Daily  . enoxaparin (LOVENOX) injection  40 mg Subcutaneous Q24H  . furosemide  40 mg Intravenous BID  . insulin aspart  0-5 Units Subcutaneous QHS  . insulin aspart  0-9 Units Subcutaneous TID WC  . lisinopril  5 mg Oral Daily  . pantoprazole  40 mg Oral Q0600  . potassium chloride  20 mEq Oral BID  . simethicone  80 mg Oral QID  . sodium chloride  3 mL Intravenous Q12H  . spironolactone  12.5 mg Oral Daily   Time spent 25 minutes   LOS: 4 days   Valerie Cones M.D. [facility] 07/17/2014, 10:35 AM Pager: [phone]  If 7PM-7AM, please contact night-coverage [email or web address] Password [email or web address]

## 2014-07-17 NOTE — Progress Notes (Signed)
Patient Name: [name] Date of Encounter: 07/17/2014  Principal Problem:   Acute combined systolic and diastolic CHF, NYHA class 3 Active Problems:   CAP (community acquired pneumonia)   Chest pain   Sinus tachycardia   Dyspnea   Abdominal pain, chronic, right upper quadrant   DM (diabetes mellitus) type 2, uncontrolled, with ketoacidosis   COPD (chronic obstructive pulmonary disease)   Obesity   Diabetes mellitus, type II   Chronic systolic CHF (congestive heart failure)   Length of Stay: 4  SUBJECTIVE  Able to lie flat and take shower without dyspnea. HF meds often held due to low BP. 4L net negative fluid balance since admission. Incessant monomorphic PVCs, average 15-20/minute  CURRENT MEDS . aspirin EC  81 mg Oral Daily  . atorvastatin  40 mg Oral q1800  . azithromycin  500 mg Oral Daily  . budesonide-formoterol  1 puff Inhalation BID  . carvedilol  3.125 mg Oral BID WC  . cefTRIAXone (ROCEPHIN)  IV  1 g Intravenous Q24H  . digoxin  0.25 mg Oral Daily  . enoxaparin (LOVENOX) injection  40 mg Subcutaneous Q24H  . furosemide  40 mg Intravenous BID  . insulin aspart  0-5 Units Subcutaneous QHS  . insulin aspart  0-9 Units Subcutaneous TID WC  . lisinopril  5 mg Oral Daily  . pantoprazole  40 mg Oral Q0600  . potassium chloride  20 mEq Oral BID  . simethicone  80 mg Oral QID  . sodium chloride  3 mL Intravenous Q12H  . spironolactone  12.5 mg Oral Daily    OBJECTIVE   Intake/Output Summary (Last 24 hours) at 07/17/14 1031 Last data filed at 07/17/14 1004  Gross per 24 hour  Intake   1310 ml  Output   4300 ml  Net  -2990 ml   Filed Weights   07/15/14 0412 07/16/14 0533 07/17/14 0545  Weight: 213 lb 13.5 oz (97 kg) 207 lb 6.4 oz (94.076 kg) 200 lb 12.8 oz (91.082 kg)    PHYSICAL EXAM Filed Vitals:   07/16/14 1954 07/16/14 2223 07/17/14 0545 07/17/14 0847  BP: 92/69  112/77 96/69  Pulse: 96  95 99  Temp: 97.2 F (36.2 C)  97.7 F (36.5 C)    TempSrc: Oral  Oral   Resp: 18  18   Height:      Weight:   200 lb 12.8 oz (91.082 kg)   SpO2: 93% 95% 98%    General: Alert, oriented x3, no distress Head: no evidence of trauma, PERRL, EOMI, no exophtalmos or lid lag, no myxedema, no xanthelasma; normal ears, nose and oropharynx Neck: normal jugular venous pulsations and no hepatojugular reflux; brisk carotid pulses without delay and no carotid bruits Chest: clear to auscultation, no signs of consolidation by percussion or palpation, normal fremitus, symmetrical and full respiratory excursions Cardiovascular: normal position and quality of the apical impulse, irregular rhythm, normal first and second heart sounds, no rubs or gallops, no murmur Abdomen: no tenderness or distention, no masses by palpation, no abnormal pulsatility or arterial bruits, normal bowel sounds, no hepatosplenomegaly Extremities: no clubbing, cyanosis or edema; 2+ radial, ulnar and brachial pulses bilaterally; 2+ right femoral, posterior tibial and dorsalis pedis pulses; 2+ left femoral, posterior tibial and dorsalis pedis pulses; no subclavian or femoral bruits Neurological: grossly nonfocal  LABS  CBC  Recent Labs  07/15/14 0446  WBC 10.9*  HGB 15.0  HCT 45.8  MCV 95.0  PLT 243   Basic Metabolic  Panel  Recent Labs  07/16/14 0637 07/17/14 0321  NA 137 136  K 3.6 3.8  CL 94* 95*  CO2 36* 36*  GLUCOSE 130* 145*  BUN 15 19  CREATININE 1.29 1.21  CALCIUM 8.7 8.8    Recent Labs  07/15/14 0736  HGBA1C 9.5*   Fasting Lipid Panel  Recent Labs  07/16/14 0637  CHOL 151  HDL 33*  LDLCALC 97  TRIG 811  CHOLHDL 4.6   Thyroid Function Tests No results for input(s): TSH, T4TOTAL, T3FREE, THYROIDAB in the last 72 hours.  Invalid input(s): FREET3  Radiology Studies Imaging results have been reviewed and No results found.  TELE Incessant monomorphic PVCs, 15-20/min; no VT  ECG NSR, PVCs quadrigeminy - inferior and rightward axis, R/S  in V1-V3 (LV outflow??)  ASSESSMENT AND PLAN  Cardiac cath in AM to exclude ischemic cardiomyopathy, but bulk of evidence suggests nonischemic (PVC-related?) cardiomyopathy. Will benefit from EP consultation and evaluation for treatment of PVCs with antiarrhythmics or RF ablation. Limited usefulness of CHF meds due to low BP.  This procedure has been fully reviewed with the patient and informed consent has been obtained.   Thurmon Fair, MD, [facility] [phone] office [phone] pager 07/17/2014 10:31 AM

## 2014-07-18 ENCOUNTER — Encounter (HOSPITAL_COMMUNITY): Payer: Self-pay | Admitting: Internal Medicine

## 2014-07-18 ENCOUNTER — Encounter (HOSPITAL_COMMUNITY)
Admission: EM | Disposition: A | Discharge status: Home / Self Care | Payer: Self-pay | Source: Home / Self Care | Attending: Internal Medicine

## 2014-07-18 DIAGNOSIS — E669 Obesity, unspecified: Secondary | ICD-10-CM

## 2014-07-18 DIAGNOSIS — I509 Heart failure, unspecified: Secondary | ICD-10-CM

## 2014-07-18 HISTORY — PX: LEFT AND RIGHT HEART CATHETERIZATION WITH CORONARY ANGIOGRAM: SHX5449

## 2014-07-18 LAB — POCT I-STAT 3, VENOUS BLOOD GAS (G3P V)
Acid-Base Excess: 2 mmol/L (ref 0.0–2.0)
Acid-Base Excess: 3 mmol/L — ABNORMAL HIGH (ref 0.0–2.0)
Acid-Base Excess: 4 mmol/L — ABNORMAL HIGH (ref 0.0–2.0)
BICARBONATE: 27.6 meq/L — AB (ref 20.0–24.0)
BICARBONATE: 30.5 meq/L — AB (ref 20.0–24.0)
Bicarbonate: 29.9 mEq/L — ABNORMAL HIGH (ref 20.0–24.0)
O2 SAT: 72 %
O2 Saturation: 73 %
O2 Saturation: 74 %
PCO2 VEN: 48.3 mmHg (ref 45.0–50.0)
PH VEN: 7.379 — AB (ref 7.250–7.300)
PH VEN: 7.398 — AB (ref 7.250–7.300)
PO2 VEN: 40 mmHg (ref 30.0–45.0)
TCO2: 32 mmol/L (ref 0–100)
TCO2: 29 mmol/L (ref 0–100)
TCO2: 31 mmol/L (ref 0–100)
pCO2, Ven: 44.7 mmHg — ABNORMAL LOW (ref 45.0–50.0)
pCO2, Ven: 50.7 mmHg — ABNORMAL HIGH (ref 45.0–50.0)
pH, Ven: 7.408 — ABNORMAL HIGH (ref 7.250–7.300)
pO2, Ven: 38 mmHg (ref 30.0–45.0)
pO2, Ven: 40 mmHg (ref 30.0–45.0)

## 2014-07-18 LAB — CREATININE, SERUM
Creatinine, Ser: 1.22 mg/dL (ref 0.50–1.35)
GFR calc non Af Amer: 61 mL/min — ABNORMAL LOW (ref >90)
GFR, EST AFRICAN AMERICAN: 71 mL/min — AB (ref >90)

## 2014-07-18 LAB — CBC
HCT: 52.6 % — ABNORMAL HIGH (ref 39.0–52.0)
HEMOGLOBIN: 17.9 g/dL — AB (ref 13.0–17.0)
MCH: 31.6 pg (ref 26.0–34.0)
MCHC: 34 g/dL (ref 30.0–36.0)
MCV: 92.8 fL (ref 78.0–100.0)
Platelets: 246 10*3/uL (ref 150–400)
RBC: 5.67 MIL/uL (ref 4.22–5.81)
RDW: 13.6 % (ref 11.5–15.5)
WBC: 7.7 10*3/uL (ref 4.0–10.5)

## 2014-07-18 LAB — BASIC METABOLIC PANEL
Anion gap: 9 (ref 5–15)
BUN: 18 mg/dL (ref 6–23)
CHLORIDE: 93 mmol/L — AB (ref 96–112)
CO2: 31 mmol/L (ref 19–32)
Calcium: 8.9 mg/dL (ref 8.4–10.5)
Creatinine, Ser: 1.08 mg/dL (ref 0.50–1.35)
GFR, EST AFRICAN AMERICAN: 82 mL/min — AB (ref >90)
GFR, EST NON AFRICAN AMERICAN: 71 mL/min — AB (ref >90)
GLUCOSE: 152 mg/dL — AB (ref 70–99)
POTASSIUM: 3.7 mmol/L (ref 3.5–5.1)
Sodium: 133 mmol/L — ABNORMAL LOW (ref 135–145)

## 2014-07-18 LAB — POCT I-STAT 3, ART BLOOD GAS (G3+)
Acid-Base Excess: 3 mmol/L — ABNORMAL HIGH (ref 0.0–2.0)
Bicarbonate: 28.5 mEq/L — ABNORMAL HIGH (ref 20.0–24.0)
O2 SAT: 94 %
PCO2 ART: 44.7 mmHg (ref 35.0–45.0)
PH ART: 7.413 (ref 7.350–7.450)
PO2 ART: 72 mmHg — AB (ref 80.0–100.0)
TCO2: 30 mmol/L (ref 0–100)

## 2014-07-18 LAB — GLUCOSE, CAPILLARY
GLUCOSE-CAPILLARY: 146 mg/dL — AB (ref 70–99)
Glucose-Capillary: 153 mg/dL — ABNORMAL HIGH (ref 70–99)
Glucose-Capillary: 141 mg/dL — ABNORMAL HIGH (ref 70–99)

## 2014-07-18 LAB — PROTIME-INR
INR: 1.07 (ref 0.00–1.49)
PROTHROMBIN TIME: 14 s (ref 11.6–15.2)

## 2014-07-18 SURGERY — LEFT AND RIGHT HEART CATHETERIZATION WITH CORONARY ANGIOGRAM
Anesthesia: LOCAL

## 2014-07-18 MED ORDER — NITROGLYCERIN 1 MG/10 ML FOR IR/CATH LAB
INTRA_ARTERIAL | Status: AC
  Filled 2014-07-18: qty 10

## 2014-07-18 MED ORDER — SODIUM CHLORIDE 0.9 % IJ SOLN: 3.0000 mL | INTRAMUSCULAR | Status: DC | PRN

## 2014-07-18 MED ORDER — ENOXAPARIN SODIUM 30 MG/0.3ML ~~LOC~~ SOLN
30.0000 mg | SUBCUTANEOUS | Status: DC
  Administered 2014-07-19: 30 mg via SUBCUTANEOUS
  Filled 2014-07-18 (×2): qty 0.3

## 2014-07-18 MED ORDER — ASPIRIN 81 MG PO CHEW
81.0000 mg | CHEWABLE_TABLET | ORAL | Status: AC
  Administered 2014-07-18: 81 mg via ORAL
  Filled 2014-07-18: qty 1

## 2014-07-18 MED ORDER — SIMETHICONE 80 MG PO CHEW
80.0000 mg | CHEWABLE_TABLET | Freq: Four times a day (QID) | ORAL | Status: DC | PRN
  Filled 2014-07-18: qty 1

## 2014-07-18 MED ORDER — FENTANYL CITRATE 0.05 MG/ML IJ SOLN
INTRAMUSCULAR | Status: AC
  Filled 2014-07-18: qty 2

## 2014-07-18 MED ORDER — SODIUM CHLORIDE 0.9 % IV SOLN: 250.0000 mL | INTRAVENOUS | Status: DC | PRN

## 2014-07-18 MED ORDER — LINAGLIPTIN 5 MG PO TABS
5.0000 mg | ORAL_TABLET | Freq: Every day | ORAL | Status: DC
  Administered 2014-07-18 – 2014-07-19 (×2): 5 mg via ORAL
  Filled 2014-07-18 (×2): qty 1

## 2014-07-18 MED ORDER — CEFUROXIME AXETIL 500 MG PO TABS
500.0000 mg | ORAL_TABLET | Freq: Two times a day (BID) | ORAL | Status: DC
  Administered 2014-07-18 – 2014-07-19 (×4): 500 mg via ORAL
  Filled 2014-07-18 (×4): qty 1

## 2014-07-18 MED ORDER — ONDANSETRON HCL 4 MG/2ML IJ SOLN: 4.0000 mg | Freq: Four times a day (QID) | INTRAMUSCULAR | Status: DC | PRN

## 2014-07-18 MED ORDER — LIDOCAINE HCL (PF) 1 % IJ SOLN
INTRAMUSCULAR | Status: AC
  Filled 2014-07-18: qty 30

## 2014-07-18 MED ORDER — PREGABALIN 25 MG PO CAPS: 25.0000 mg | ORAL_CAPSULE | Freq: Every day | ORAL | Status: DC

## 2014-07-18 MED ORDER — ACETAMINOPHEN 325 MG PO TABS: 650.0000 mg | ORAL_TABLET | ORAL | Status: DC | PRN

## 2014-07-18 MED ORDER — SODIUM CHLORIDE 0.9 % IV SOLN
INTRAVENOUS | Status: AC
  Administered 2014-07-18: 16:00:00 via INTRAVENOUS

## 2014-07-18 MED ORDER — VERAPAMIL HCL 2.5 MG/ML IV SOLN
INTRAVENOUS | Status: AC
  Filled 2014-07-18: qty 2

## 2014-07-18 MED ORDER — FUROSEMIDE 40 MG PO TABS
40.0000 mg | ORAL_TABLET | Freq: Two times a day (BID) | ORAL | Status: DC
  Administered 2014-07-19 (×2): 40 mg via ORAL
  Filled 2014-07-18 (×3): qty 1

## 2014-07-18 MED ORDER — MIDAZOLAM HCL 2 MG/2ML IJ SOLN
INTRAMUSCULAR | Status: AC
  Filled 2014-07-18: qty 2

## 2014-07-18 MED ORDER — HEPARIN SODIUM (PORCINE) 1000 UNIT/ML IJ SOLN
INTRAMUSCULAR | Status: AC
  Filled 2014-07-18: qty 1

## 2014-07-18 MED ORDER — HEPARIN (PORCINE) IN NACL 2-0.9 UNIT/ML-% IJ SOLN
INTRAMUSCULAR | Status: AC
  Filled 2014-07-18: qty 1500

## 2014-07-18 MED ORDER — SODIUM CHLORIDE 0.9 % IJ SOLN
3.0000 mL | Freq: Two times a day (BID) | INTRAMUSCULAR | Status: DC
  Administered 2014-07-18: 3 mL via INTRAVENOUS

## 2014-07-18 MED ORDER — DOCUSATE SODIUM 100 MG PO CAPS
100.0000 mg | ORAL_CAPSULE | Freq: Two times a day (BID) | ORAL | Status: DC
  Administered 2014-07-18 – 2014-07-19 (×2): 100 mg via ORAL
  Filled 2014-07-18 (×3): qty 1

## 2014-07-18 NOTE — Interval H&P Note (Signed)
History and Physical Interval Note:  07/18/2014 11:33 AM  [name]  has presented today for surgery, with the diagnosis of HF  The various methods of treatment have been discussed with the patient and family. After consideration of risks, benefits and other options for treatment, the patient has consented to  Procedure(s): LEFT AND RIGHT HEART CATHETERIZATION WITH CORONARY ANGIOGRAM (N/A) and possible angioplasty as a surgical intervention .  The patient's history has been reviewed, patient examined, no change in status, stable for surgery.  I have reviewed the patient's chart and labs.  Questions were answered to the patient's satisfaction.    Cath Lab Visit (complete for each Cath Lab visit)  Clinical Evaluation Leading to the Procedure:   ACS: No.  Non-ACS:    Anginal Classification: CCS IV  Anti-ischemic medical therapy: Minimal Therapy (1 class of medications)  Non-Invasive Test Results: No non-invasive testing performed  Prior CABG: No previous CABG        Arvilla Meres

## 2014-07-18 NOTE — Progress Notes (Signed)
Patient ID: [name]  male  JOI:[id number]    DOB: 09-Dec-1950    DOA: 07/13/2014  PCP: Lupita Raider, MD   Brief history of present illness  Patient is a 64 year old male with COPD, not on home O2, was in his usual state of health until 2-3 days prior to admission when he started developing pain in the left side of his chest, sharp without any radiation, 4/10 in intensity. Associated with shortness of breath and coughing with clear expectoration. He also noticed some difficulty while lying down. He denied any fevers per se. He noticed some leg swelling. Chest pain increased with coughing and deep breathing. Patient had cold-like symptoms earlier this month and his grandchildren had been sick. Patient also reported for past many months having abdominal bloating and distention, worked up by his gastroenterologist, underwent a colonoscopy on Monday which apparently was unremarkable.  Assessment/Plan: Principal Problem: Acute combined systolic and diastolic CHF, moderate mitral regurgitation, chest pain - 2-D echo showed EF of 15-20% with diffuse hypokinesis, grade 3 diastolic dysfunction - Discussing this in detail with the patient, he reported that he had been getting fatigued since October of last year, with dyspnea on exertion, abdominal bloating, may have been related to CHF rather than GI (patient has been going through GI workup outpatient which has been negative so far). -  Continue aspirin. Currently lisinopril, spironolactone, beta blocker on hold due to hypotension, - Cardiac cath done today, minimal nonobstructive coronary artery disease but severe nonischemic cardiomyopathy of unclear etiology, per cardiology, continue to titrate meds will need to see response to medical therapy before considering CRT-D, consider LifeVest prior to discharge, likely tomorrow or Wednesday     CAP (community acquired pneumonia) with pleuritic chest pain - CT angiogram of the chest negative for any PE,  continue IV Zithromax, Rocephin -  continue Symbicort, Xopenex, prednisone (allergy protocol)     Sinus tachycardia - Placed on metoprolol    abdominal bloating with  Abdominal pain, chronic, right upper quadrant -Patient denies endoscopy, has been having abdominal bloating with abdominal pain after eating for several months. Ultrasound of the abdomen showed no gallstones and no sonographic Murphy sign. CT chest showed minimal stranding noted about the gallbladder with question of mild pericholecystic fluid, question mild cholecystitis. Currently patient has no abdominal pain LFTs are normal - HIDA scan scan normal/negative.  - Continue PPI, simethicone  Type 2 diabetes mellitus: New diagnosis. The patient reports that he had been checking his blood sugar at home and were running in 200s however he was not on any hypoglycemics. He was working on his diet and weight control. -  A1c 9.5, continue sliding scale insulin, diabetic education - Placed on tradjenta  DVT Prophylaxis: Lovenox   Code Status: full code   Family Communication:  D/w patient's wife, Steward Drone in detail at the bedside  Disposition:  Consultants: Cardiology  Procedures   2-D echo  Antibiotics: IV Zithromax  IV Rocephin   Subjective   Patient seen and examined, prior to cardiac cath, wife at the bedside, no chest pain or shortness of breath.  Objective Weight change: -2.404 kg (-5 lb 4.8 oz)  Intake/Output Summary (Last 24 hours) at 07/18/14 1243 Last data filed at 07/18/14 1030  Gross per 24 hour  Intake    820 ml  Output   1850 ml  Net  -1030 ml   Blood pressure 102/67, pulse 91, temperature 97.9 F (36.6 C), temperature source Oral, resp. rate 17, height 5\' 7"  (  1.702 m), weight 88.678 kg (195 lb 8 oz), SpO2 96 %.  Physical Exam: General: Alert and awake, oriented x3, NAD CVS: S1-S2 clear Chest: decreased breath sounds at the bases Abdomen:  Obese, soft nontender, NT, NBS Extremities: no  cyanosis, clubbing or edema noted bilaterally   Lab Results: Basic Metabolic Panel:  Recent Labs Lab 07/17/14 0321 07/18/14 0357  NA 136 133*  K 3.8 3.7  CL 95* 93*  CO2 36* 31  GLUCOSE 145* 152*  BUN 19 18  CREATININE 1.21 1.08  CALCIUM 8.8 8.9   Liver Function Tests:  Recent Labs Lab 07/13/14 0957  AST 19  ALT 19  ALKPHOS 58  BILITOT 2.0*  PROT 7.2  ALBUMIN 3.6    Recent Labs Lab 07/13/14 0957  LIPASE 43   No results for input(s): AMMONIA in the last 168 hours. CBC:  Recent Labs Lab 07/14/14 0120 07/15/14 0446  WBC 5.0 10.9*  HGB 14.8 15.0  HCT 44.9 45.8  MCV 96.8 95.0  PLT 208 243   Cardiac Enzymes:  Recent Labs Lab 07/13/14 1417 07/13/14 2017 07/14/14 0120  TROPONINI <0.03 <0.03 <0.03   BNP: Invalid input(s): POCBNP CBG:  Recent Labs Lab 07/17/14 0609 07/17/14 1058 07/17/14 1617 07/17/14 2116 07/18/14 0608  GLUCAP 146* 197* 182* 154* 153*     Micro Results: No results found for this or any previous visit (from the past 240 hour(s)).  Studies/Results: Dg Chest 2 View  07/13/2014   CLINICAL DATA:  64 year old male with shortness of breath cough and chest tightness for 2 days. Current history of COPD. Initial encounter.  EXAM: CHEST  2 VIEW  COMPARISON:  CT Abdomen and Pelvis 06/20/2014. Chest radiographs 06/18/2014 and earlier.  FINDINGS: Stable somewhat low lung volumes. Vague increased left mid lung opacity from the prior studies, but at the same time increased lower lobe opacity appears regressed. No pneumothorax. No pleural effusion identified. Stable cardiac size and mediastinal contours. Visualized tracheal air column is within normal limits. Underlying chronic increased interstitial markings. No acute osseous abnormality identified.  IMPRESSION: Vague increased left mid lung opacity compatible with acute pneumonia superimposed on chronic lung disease.  Post treatment radiographs recommended to document resolution.    Electronically Signed   By: Augusto Gamble M.D.   On: 07/13/2014 09:39   Ct Angio Chest Pe W/cm &/or Wo Cm  07/14/2014   CLINICAL DATA:  Chest pain, worsened with breathing or coughing. Shortness of breath. Initial encounter.  EXAM: CT ANGIOGRAPHY CHEST WITH CONTRAST  TECHNIQUE: Multidetector CT imaging of the chest was performed using the standard protocol during bolus administration of intravenous contrast. Multiplanar CT image reconstructions and MIPs were obtained to evaluate the vascular anatomy.  CONTRAST:  OMNIPAQUE IOHEXOL 350 MG/ML SOLN  COMPARISON:  Chest radiograph performed 07/13/2014  FINDINGS: There is no evidence of pulmonary embolus.  Small bilateral pleural effusions are noted, with increased interstitial prominence, likely reflecting mild pulmonary edema. The effusion is slightly loculated on the left side. A large 1.5 cm calcified granuloma is again noted at the left lung base. There is no evidence of pneumothorax. No masses are identified; no abnormal focal contrast enhancement is seen.  There is minimal aneurysmal dilatation of the ascending thoracic aorta, measuring 4.0 cm in AP dimension, and mild aneurysmal dilatation of the proximal aortic arch, measuring 3.8 cm in diameter. Scattered coronary artery calcifications are seen. No mediastinal lymphadenopathy is appreciated. No pericardial effusion is identified. Mild scattered calcification is noted along the proximal great  vessels. No axillary lymphadenopathy is seen. The thyroid gland is diminutive and unremarkable in appearance.  The visualized portions of the liver and spleen are unremarkable. Minimal stranding is noted about the gallbladder, with question of mild pericholecystic fluid. Would correlate for symptoms of mild acute cholecystitis. The visualized portions of the pancreas, adrenal glands and kidneys are grossly unremarkable, aside from mild nonspecific perinephric stranding.  No acute osseous abnormalities are seen.  Review  of the MIP images confirms the above findings.  IMPRESSION: 1. No evidence of pulmonary embolus. 2. Small bilateral pleural effusions, with increased interstitial prominence, likely reflecting mild pulmonary edema. The left-sided pleural effusion is slightly loculated. 3. Minimal soft tissue stranding about the gallbladder, with suggestion of mild pericholecystic fluid. Would correlate for symptoms of mild acute cholecystitis. 4. Minimal aneurysmal dilatation of the ascending thoracic aorta, measuring up to 4.0 cm in AP dimension, and mild aneurysmal dilatation of the proximal aortic arch, measuring 3.8 cm in diameter. Recommend annual imaging followup by CTA or MRA. This recommendation follows 2010 ACCF/AHA/AATS/ACR/ASA/SCA/SCAI/SIR/STS/SVM Guidelines for the Diagnosis and Management of Patients with Thoracic Aortic Disease. Circulation.2010; 121: e266-e369 5. Scattered coronary artery calcifications seen.   Electronically Signed   By: Roanna Raider M.D.   On: 07/14/2014 02:18   Nm Hepatobiliary Including Gb  07/14/2014   CLINICAL DATA:  Bloating and abdominal pressure sensation, suspect mild cholecystitis  EXAM: NUCLEAR MEDICINE HEPATOBILIARY IMAGING WITH GALLBLADDER EF  TECHNIQUE: Sequential images of the abdomen were obtained out to 60 minutes following intravenous administration of radiopharmaceutical. After slow intravenous infusion of 1.9 micrograms Cholecystokinin, gallbladder ejection fraction was determined.  RADIOPHARMACEUTICALS:  1.0 Millicurie Tc-61m Choletec  COMPARISON:  Abdominal ultrasound dated July 13, 2014  FINDINGS: There is adequate uptake of the radiopharmaceutical by the liver. The gallbladder is visualized by 20 min. Bowel activity is also evident by 15-20 min. The 30 min gallbladder ejection fraction is 79%. At 30 min, normal ejection fraction is greater than 30%.  The patient did not experience symptoms during CCK infusion.  IMPRESSION: Normal hepatobiliary scan and normal  gallbladder ejection fraction.   Electronically Signed   By: David  [place]   On: 07/14/2014 13:59   US Abdomen Complete  07/13/2014   CLINICAL DATA:  64 year old male with 1 day history of right upper quadrant pain  EXAM: ULTRASOUND ABDOMEN COMPLETE  COMPARISON:  None.  FINDINGS: Gallbladder: No gallstones or wall thickening visualized. No sonographic Murphy sign noted.  Common bile duct: Diameter: Within normal limits at 5 mm  Liver: No focal lesion identified. Within normal limits in parenchymal echogenicity. The main portal vein is patent with normal hepatopetal flow.  IVC: No abnormality visualized.  Pancreas: Visualized portion unremarkable. The tail is largely obscured by overlying bowel gas.  Spleen: Size and appearance within normal limits.  Right Kidney: Length: 13.2 cm. Echogenicity within normal limits. No mass or hydronephrosis visualized.  Left Kidney: Length: 13.6 cm. Echogenicity within normal limits. No mass or hydronephrosis visualized.  Abdominal aorta: Largely obscured by bowel gas. However, recent CTA of the abdomen and pelvis demonstrates a normal caliber aorta.  Other findings:  Small right-sided pleural effusion.  IMPRESSION: 1. No acute abnormality in the abdomen to explain the patient's clinical symptoms. 2. Small right-sided pleural effusion noted incidentally.   Electronically Signed   By: Malachy Moan M.D.   On: 07/13/2014 11:19   Ct Abdomen Pelvis W Contrast  06/20/2014   CLINICAL DATA:  Abdominal pain and bloating for 4-5 months. No history  of cancer or surgery. Lower abdominal pain.  EXAM: CT ABDOMEN AND PELVIS WITH CONTRAST  TECHNIQUE: Multidetector CT imaging of the abdomen and pelvis was performed using the standard protocol following bolus administration of intravenous contrast.  CONTRAST:  OMNIPAQUE IOHEXOL 300 MG/ML  SOLN  13 hour prep.  No reaction.  COMPARISON:  04/02/2005  FINDINGS: Lower chest: Calcified granuloma at the left lung base. Dependent changes at  the right lung base. No pericardial effusion.  Upper abdomen: Fatty liver without focal lesion. The gallbladder is present. No focal abnormality identified within the spleen or pancreas. Left renal cyst is 1.7 cm. No hydronephrosis.  Gastrointestinal tract: The stomach and small bowel loops are normal in appearance. The appendix is well seen and has a normal appearance. Colonic diverticula are present.  Pelvis: Urinary bladder and prostate gland have a normal CT appearance. No free pelvic fluid. Fat containing left inguinal hernia.  Retroperitoneum: No retroperitoneal or mesenteric adenopathy.  Abdominal wall: Small fat containing umbilical hernia.  Osseous structures: Degenerative changes in the spine. Visualized osseous structures have a normal appearance.  IMPRESSION: 1. Fatty liver. 2. Left renal cyst. 3. Normal appendix. 4. Colonic diverticulosis. 5. Fat containing left inguinal hernia. 6. Fat containing umbilical hernia.   Electronically Signed   By: Rosalie Gums M.D.   On: 06/20/2014 14:58    Medications: Scheduled Meds: . aspirin EC  81 mg Oral Daily  . atorvastatin  40 mg Oral q1800  . azithromycin  500 mg Oral Daily  . budesonide-formoterol  1 puff Inhalation BID  . carvedilol  3.125 mg Oral BID WC  . cefUROXime  500 mg Oral BID WC  . digoxin  0.25 mg Oral Daily  . docusate sodium  100 mg Oral BID  . enoxaparin (LOVENOX) injection  40 mg Subcutaneous Q24H  . furosemide  40 mg Intravenous BID  . insulin aspart  0-5 Units Subcutaneous QHS  . insulin aspart  0-9 Units Subcutaneous TID WC  . lisinopril  5 mg Oral Daily  . pantoprazole  40 mg Oral Q0600  . potassium chloride  20 mEq Oral BID  . pregabalin  25 mg Oral Daily  . simethicone  80 mg Oral QID  . sodium chloride  3 mL Intravenous Q12H  . sodium chloride  3 mL Intravenous Q12H  . spironolactone  12.5 mg Oral Daily   Time spent 25 minutes   LOS: 5 days   RAI,RIPUDEEP M.D. [facility] 07/18/2014, 12:43 PM Pager:  [phone]  If 7PM-7AM, please contact night-coverage [email or web address] Password [email or web address]

## 2014-07-18 NOTE — Progress Notes (Signed)
1545 3cc removed Observed to have scant amount blood ooze out of insertion site # cc placed back up to 31ml. Reinforced to elevate arm on pillow but hesitant and refused to do so . Made [place] aware. monnitored

## 2014-07-18 NOTE — H&P (View-Only) (Signed)
Patient Name: [name] Date of Encounter: 07/17/2014  Principal Problem:   Acute combined systolic and diastolic CHF, NYHA class 3 Active Problems:   CAP (community acquired pneumonia)   Chest pain   Sinus tachycardia   Dyspnea   Abdominal pain, chronic, right upper quadrant   DM (diabetes mellitus) type 2, uncontrolled, with ketoacidosis   COPD (chronic obstructive pulmonary disease)   Obesity   Diabetes mellitus, type II   Chronic systolic CHF (congestive heart failure)   Length of Stay: 4  SUBJECTIVE  Able to lie flat and take shower without dyspnea. HF meds often held due to low BP. 4L net negative fluid balance since admission. Incessant monomorphic PVCs, average 15-20/minute  CURRENT MEDS . aspirin EC  81 mg Oral Daily  . atorvastatin  40 mg Oral q1800  . azithromycin  500 mg Oral Daily  . budesonide-formoterol  1 puff Inhalation BID  . carvedilol  3.125 mg Oral BID WC  . cefTRIAXone (ROCEPHIN)  IV  1 g Intravenous Q24H  . digoxin  0.25 mg Oral Daily  . enoxaparin (LOVENOX) injection  40 mg Subcutaneous Q24H  . furosemide  40 mg Intravenous BID  . insulin aspart  0-5 Units Subcutaneous QHS  . insulin aspart  0-9 Units Subcutaneous TID WC  . lisinopril  5 mg Oral Daily  . pantoprazole  40 mg Oral Q0600  . potassium chloride  20 mEq Oral BID  . simethicone  80 mg Oral QID  . sodium chloride  3 mL Intravenous Q12H  . spironolactone  12.5 mg Oral Daily    OBJECTIVE   Intake/Output Summary (Last 24 hours) at 07/17/14 1031 Last data filed at 07/17/14 1004  Gross per 24 hour  Intake   1310 ml  Output   4300 ml  Net  -2990 ml   Filed Weights   07/15/14 0412 07/16/14 0533 07/17/14 0545  Weight: 213 lb 13.5 oz (97 kg) 207 lb 6.4 oz (94.076 kg) 200 lb 12.8 oz (91.082 kg)    PHYSICAL EXAM Filed Vitals:   07/16/14 1954 07/16/14 2223 07/17/14 0545 07/17/14 0847  BP: 92/69  112/77 96/69  Pulse: 96  95 99  Temp: 97.2 F (36.2 C)  97.7 F (36.5 C)    TempSrc: Oral  Oral   Resp: 18  18   Height:      Weight:   200 lb 12.8 oz (91.082 kg)   SpO2: 93% 95% 98%    General: Alert, oriented x3, no distress Head: no evidence of trauma, PERRL, EOMI, no exophtalmos or lid lag, no myxedema, no xanthelasma; normal ears, nose and oropharynx Neck: normal jugular venous pulsations and no hepatojugular reflux; brisk carotid pulses without delay and no carotid bruits Chest: clear to auscultation, no signs of consolidation by percussion or palpation, normal fremitus, symmetrical and full respiratory excursions Cardiovascular: normal position and quality of the apical impulse, irregular rhythm, normal first and second heart sounds, no rubs or gallops, no murmur Abdomen: no tenderness or distention, no masses by palpation, no abnormal pulsatility or arterial bruits, normal bowel sounds, no hepatosplenomegaly Extremities: no clubbing, cyanosis or edema; 2+ radial, ulnar and brachial pulses bilaterally; 2+ right femoral, posterior tibial and dorsalis pedis pulses; 2+ left femoral, posterior tibial and dorsalis pedis pulses; no subclavian or femoral bruits Neurological: grossly nonfocal  LABS  CBC  Recent Labs  07/15/14 0446  WBC 10.9*  HGB 15.0  HCT 45.8  MCV 95.0  PLT 243   Basic Metabolic   Panel  Recent Labs  07/16/14 0637 07/17/14 0321  NA 137 136  K 3.6 3.8  CL 94* 95*  CO2 36* 36*  GLUCOSE 130* 145*  BUN 15 19  CREATININE 1.29 1.21  CALCIUM 8.7 8.8    Recent Labs  07/15/14 0736  HGBA1C 9.5*   Fasting Lipid Panel  Recent Labs  07/16/14 0637  CHOL 151  HDL 33*  LDLCALC 97  TRIG 103  CHOLHDL 4.6   Thyroid Function Tests No results for input(s): TSH, T4TOTAL, T3FREE, THYROIDAB in the last 72 hours.  Invalid input(s): FREET3  Radiology Studies Imaging results have been reviewed and No results found.  TELE Incessant monomorphic PVCs, 15-20/min; no VT  ECG NSR, PVCs quadrigeminy - inferior and rightward axis, R/S  in V1-V3 (LV outflow??)  ASSESSMENT AND PLAN  Cardiac cath in AM to exclude ischemic cardiomyopathy, but bulk of evidence suggests nonischemic (PVC-related?) cardiomyopathy. Will benefit from EP consultation and evaluation for treatment of PVCs with antiarrhythmics or RF ablation. Limited usefulness of CHF meds due to low BP.  This procedure has been fully reviewed with the patient and informed consent has been obtained.   Alease Fait, MD, FACC [facility] [phone] office [phone] pager 07/17/2014 10:31 AM   

## 2014-07-18 NOTE — CV Procedure (Signed)
Cardiac Cath Procedure Note:  Indication: New onset heart failure with severe LV dysfunction  Procedures performed:  1) Selective coronary angiography 2) Right heart catheterization   Description of procedure:   The risks and indication of the procedure were explained. Consent was signed and placed on the chart. An appropriate timeout was taken prior to the procedure.   The pre-existing PIV in the right antecubital fossa was removed over a wire and exchanged for a 5FR brachial sheath. A 5FR Swan catheter was used for the RHC.  After a normal Allen's test was confirmed, the right wrist was prepped and draped in the routine sterile fashion and anesthetized with 1% local lidocaine.   A 5 FR arterial sheath was then placed in the right radial artery using a modified Seldinger technique. Systemic heparin was administered. 3mg  IV verapamil was given through the sheath. Standard catheters including a JL 3.5, JR4 were used. All catheter exchanges were made over a wire. We were unable to get the pigtail into the ascending aorta due to significant vasospasm not relieved with intra-arterial NTG.   Complications:  None apparent  Contrast: 50 cc  Findings:  RA:  5 RV:  29/8/9 PA:  30/11 (21) PCWP: 16 Fick CO/CI: 6.0/3.0 PVR: 0.8 WU SVR: 1042 Ao sat: 94% PA sats: 73%, 74% SVC sat: 72%  Ao Pressure: 95/72 (83)  Left main: Normal  LAD: Large vessel. Wraps apex. Small D1. Large D2. 20-30% plaque in proximal and distal LAD and D2. TIMI-2 flow throughout vessel.   LCX: Small OM-1. Large branching OM-2. Normal. TIMI-2 flow.   RCA: Dominant vessel. Minimal plaque.   Assessment: 1. Minimal non-obstructive CAD 2. Severe NICM with well-compensated hemodynamics  Plan/Discussion:  He has a severe NICM of unclear etiology. Hemodynamics well compensated. Continue to titrate meds. Will need to wait to see response to medical therapy before considering CRT-D. Consider LifeVest prior to d/c.  Possibly home tomorrow or Wednesday.   Daniel BensimhonMD 12:18 PM

## 2014-07-18 NOTE — Progress Notes (Signed)
Asa not given . Endorsed to [employer]. Med unavailable

## 2014-07-19 LAB — BASIC METABOLIC PANEL
Anion gap: 11 (ref 5–15)
BUN: 19 mg/dL (ref 6–23)
CALCIUM: 8.7 mg/dL (ref 8.4–10.5)
CO2: 23 mmol/L (ref 19–32)
Chloride: 97 mmol/L (ref 96–112)
Creatinine, Ser: 1.12 mg/dL (ref 0.50–1.35)
GFR, EST AFRICAN AMERICAN: 78 mL/min — AB (ref >90)
GFR, EST NON AFRICAN AMERICAN: 68 mL/min — AB (ref >90)
Glucose, Bld: 134 mg/dL — ABNORMAL HIGH (ref 70–99)
POTASSIUM: 4.5 mmol/L (ref 3.5–5.1)
SODIUM: 131 mmol/L — AB (ref 135–145)

## 2014-07-19 LAB — GLUCOSE, CAPILLARY
GLUCOSE-CAPILLARY: 164 mg/dL — AB (ref 70–99)
Glucose-Capillary: 144 mg/dL — ABNORMAL HIGH (ref 70–99)
Glucose-Capillary: 119 mg/dL — ABNORMAL HIGH (ref 70–99)

## 2014-07-19 LAB — MAGNESIUM: Magnesium: 2.1 mg/dL (ref 1.5–2.5)

## 2014-07-19 MED ORDER — SITAGLIPTIN PHOSPHATE 100 MG PO TABS: 100.0000 mg | ORAL_TABLET | Freq: Every day | ORAL | Status: DC

## 2014-07-19 MED ORDER — DIGOXIN 250 MCG PO TABS: 0.2500 mg | ORAL_TABLET | Freq: Every day | ORAL | Status: DC

## 2014-07-19 MED ORDER — CEFUROXIME AXETIL 500 MG PO TABS: 500.0000 mg | ORAL_TABLET | Freq: Two times a day (BID) | ORAL | Status: DC

## 2014-07-19 MED ORDER — AZITHROMYCIN 500 MG PO TABS: 500.0000 mg | ORAL_TABLET | Freq: Every day | ORAL | Status: DC

## 2014-07-19 MED ORDER — ASPIRIN 81 MG PO TBEC: 81.0000 mg | DELAYED_RELEASE_TABLET | Freq: Every day | ORAL | Status: DC

## 2014-07-19 MED ORDER — PANTOPRAZOLE SODIUM 40 MG PO TBEC: 40.0000 mg | DELAYED_RELEASE_TABLET | Freq: Every day | ORAL | Status: DC

## 2014-07-19 MED ORDER — METFORMIN HCL 500 MG PO TABS: 500.0000 mg | ORAL_TABLET | Freq: Every day | ORAL | Status: DC

## 2014-07-19 MED ORDER — CARVEDILOL 3.125 MG PO TABS: 3.1250 mg | ORAL_TABLET | Freq: Two times a day (BID) | ORAL | Status: DC

## 2014-07-19 MED ORDER — ATORVASTATIN CALCIUM 40 MG PO TABS: 40.0000 mg | ORAL_TABLET | Freq: Every day | ORAL | Status: DC

## 2014-07-19 MED ORDER — POTASSIUM CHLORIDE ER 20 MEQ PO TBCR: 20.0000 meq | EXTENDED_RELEASE_TABLET | Freq: Two times a day (BID) | ORAL | Status: DC

## 2014-07-19 MED ORDER — SIMETHICONE 80 MG PO CHEW: 80.0000 mg | CHEWABLE_TABLET | Freq: Four times a day (QID) | ORAL | Status: AC | PRN

## 2014-07-19 MED ORDER — FUROSEMIDE 40 MG PO TABS: 40.0000 mg | ORAL_TABLET | Freq: Two times a day (BID) | ORAL | Status: DC

## 2014-07-19 MED ORDER — LISINOPRIL 5 MG PO TABS: 5.0000 mg | ORAL_TABLET | Freq: Every day | ORAL | Status: DC

## 2014-07-19 MED ORDER — DOCUSATE SODIUM 100 MG PO CAPS: 100.0000 mg | ORAL_CAPSULE | Freq: Two times a day (BID) | ORAL | Status: DC

## 2014-07-19 MED ORDER — SPIRONOLACTONE 25 MG PO TABS: 12.5000 mg | ORAL_TABLET | Freq: Every day | ORAL | Status: DC

## 2014-07-19 NOTE — Progress Notes (Signed)
CARDIAC REHAB PHASE I   PRE:  Rate/Rhythm: 90 SR  BP:  Supine:   Sitting: 88/63  Standing:    SaO2: 93 RA  MODE:  Ambulation: 760 ft   POST:  Rate/Rhythm: 104  BP:  Supine:   Sitting: 88/60  Standing:    SaO2: 93 RA 1315-1415 Pt tolerated ambulation well without c/o of cp or SOB. Gait steady. Pt states he is feeling so much better, less SOB. Completed CHD education withpt. We discussed CHF zones,, daily weights, low sodium and low carb diet,when to call lMD and 911. He states that he has watched CHF and life vest videos. He declines exercise guidelines and Outpt. CRP. States I do lots of work a lot of work around his house and that he never sits still. Pt is not interested in organized exercise.  Melina Copa RN 07/19/2014 2:17 PM

## 2014-07-19 NOTE — Progress Notes (Signed)
Advanced Heart Failure Rounding Note   Subjective:    NICM on HF meds. Had cath yesterday. Denies SOB/Orthopnea/CP   RHC/LHC 07/18/2014 RA: 5 RV: 29/8/9 PA: 30/11 (21) PCWP: 16 Fick CO/CI: 6.0/3.0 PVR: 0.8 WU SVR: 1042 Ao sat: 94% PA sats: 73%, 74% SVC sat: 72% Large vessel. Wraps apex. Small D1. Large D2. 20-30% plaque in proximal and distal LAD and D2   Objective:   Weight Range:  Vital Signs:   Temp:  [97.5 F (36.4 C)-98.5 F (36.9 C)] 98 F (36.7 C) (02/02 1019) Pulse Rate:  [80-92] 92 (02/02 1019) Resp:  [12-16] 16 (02/02 1019) BP: (91-111)/(60-86) 97/69 mmHg (02/02 1019) SpO2:  [93 %-97 %] 97 % (02/02 1019) Weight:  [196 lb 8 oz (89.132 kg)] 196 lb 8 oz (89.132 kg) (02/02 0642) Last BM Date: 07/17/14  Weight change: Filed Weights   07/17/14 0545 07/18/14 0612 07/19/14 0642  Weight: 200 lb 12.8 oz (91.082 kg) 195 lb 8 oz (88.678 kg) 196 lb 8 oz (89.132 kg)    Intake/Output:   Intake/Output Summary (Last 24 hours) at 07/19/14 1110 Last data filed at 07/19/14 0900  Gross per 24 hour  Intake    600 ml  Output    300 ml  Net    300 ml     Physical Exam: General:  Well appearing. No resp difficulty. Walking in his room  HEENT: normal Neck: supple. JVP 5-6 . Carotids 2+ bilat; no bruits. No lymphadenopathy or thryomegaly appreciated. Cor: PMI nondisplaced. Regular rate & rhythm. No rubs, gallops or murmurs. Lungs: clear Abdomen: soft, nontender, nondistended. No hepatosplenomegaly. No bruits or masses. Good bowel sounds. Extremities: no cyanosis, clubbing, rash, edema Neuro: alert & orientedx3, cranial nerves grossly intact. moves all 4 extremities w/o difficulty. Affect pleasant  Telemetry: SR 90s   Labs: Basic Metabolic Panel:  Recent Labs Lab 07/14/14 0120 07/15/14 0446 07/16/14 0637 07/17/14 0321 07/18/14 0357 07/18/14 1435  NA 134* 137 137 136 133*  --   K 4.2 4.1 3.6 3.8 3.7  --   CL 102 101 94* 95* 93*  --   CO2 25 27 36* 36* 31   --   GLUCOSE 247* 243* 130* 145* 152*  --   BUN 12 12 15 19 18   --   CREATININE 0.99 1.07 1.29 1.21 1.08 1.22  CALCIUM 8.3* 8.9 8.7 8.8 8.9  --     Liver Function Tests:  Recent Labs Lab 07/13/14 0957  AST 19  ALT 19  ALKPHOS 58  BILITOT 2.0*  PROT 7.2  ALBUMIN 3.6    Recent Labs Lab 07/13/14 0957  LIPASE 43   No results for input(s): AMMONIA in the last 168 hours.  CBC:  Recent Labs Lab 07/13/14 0923 07/14/14 0120 07/15/14 0446 07/18/14 1435  WBC 8.9 5.0 10.9* 7.7  HGB 16.4 14.8 15.0 17.9*  HCT 48.6 44.9 45.8 52.6*  MCV 93.8 96.8 95.0 92.8  PLT 228 208 243 246    Cardiac Enzymes:  Recent Labs Lab 07/13/14 1417 07/13/14 2017 07/14/14 0120  TROPONINI <0.03 <0.03 <0.03    BNP: BNP (last 3 results)  Recent Labs  07/13/14 0923  BNP 877.1*    ProBNP (last 3 results) No results for input(s): PROBNP in the last 8760 hours.    Other results:  EKG:   Imaging:  No results found.   Medications:     Scheduled Medications: . aspirin EC  81 mg Oral Daily  . atorvastatin  40 mg Oral  q1800  . azithromycin  500 mg Oral Daily  . budesonide-formoterol  1 puff Inhalation BID  . carvedilol  3.125 mg Oral BID WC  . cefUROXime  500 mg Oral BID WC  . digoxin  0.25 mg Oral Daily  . docusate sodium  100 mg Oral BID  . enoxaparin (LOVENOX) injection  30 mg Subcutaneous Q24H  . furosemide  40 mg Oral BID  . insulin aspart  0-5 Units Subcutaneous QHS  . insulin aspart  0-9 Units Subcutaneous TID WC  . linagliptin  5 mg Oral Daily  . lisinopril  5 mg Oral Daily  . pantoprazole  40 mg Oral Q0600  . potassium chloride  20 mEq Oral BID  . sodium chloride  3 mL Intravenous Q12H  . spironolactone  12.5 mg Oral Daily     Infusions:     PRN Medications:  acetaminophen, levalbuterol, morphine injection, ondansetron (ZOFRAN) IV, oxyCODONE, polyethylene glycol, simethicone, zolpidem   Assessment:  1. A/C Systolic Heart Failure 2. NICM per cath on  07/18/14 3. PVCs 4. CAP 5. DM II Uncontrolled 6. Chest Pain- LHC ok on 07/18/14 7. COPD     Plan/Discussion:   Had LHC/RHC yesterday well compensated HF with minimal nonobstructive CAD. NICM-possible viral.  07/15/14 ECHO EF 20-25%  Volume status stable. Continue lasix 40 mg po twice a day. BP soft continue current dose of carvedilol, lisinopril, and spiro.   BMET pending if ok may be able to go home.   Life Vest application with letter of medical necessity completed.   CAP - on antibiotics per primary team.   F/U in HF clinic next week 07/25/14 at 11:15. Lengthy discussion regarding daily weights and low diet with fluid restrictions.   Length of Stay: 6   CLEGG,AMY NP-C  07/19/2014, 11:10 AM  Advanced Heart Failure Team Pager [phone] (M-F; 7a - 4p)  Please contact [facility] for night-coverage after hours (4p -7a ) and weekends on [email or web address]  Patient seen and examined with Tonye Becket, NP. We discussed all aspects of the encounter. I agree with the assessment and plan as stated above.   Looks much better. Cath results reviewed. Telemetry reviewed PVC burden does not seem high enough to cause cardiomyopathy. Agree with d/c today on current regimen. Life Vest to be placed today. F/u in HF Clinic on Monday. Appreciate [facility] help.   Daniel Bensimhon,MD 1:38 PM

## 2014-07-19 NOTE — Discharge Summary (Signed)
Physician Discharge Summary  Patient ID: JAUN GALLUZZO MRN: [id number] DOB/AGE: 11-13-1950 64 y.o.  Admit date: 07/13/2014 Discharge date: 07/19/2014  Primary Care Physician:  Lupita Raider, MD  Discharge Diagnoses:   . Acute combined systolic and diastolic CHF, NYHA class 3 . CAP (community acquired pneumonia) . DM (diabetes mellitus) type 2, uncontrolled- new diagnosis  . Chest pain . Dyspnea . Sinus tachycardia . COPD (chronic obstructive pulmonary disease) . Obesity   Consults: Cardiology, Dr. Gala Romney  Recommendations for Outpatient Follow-up:   Please check BMET, hemoglobin A1c closely, patient should benefit from diabetic education classes outpatient. Hemoglobin A1c is 9.5.  LifeVest will be delivered and fitted before DC  Patient was reminded to closely follow with the Dr. Gala Romney, cardiology, appointment scheduled    DIET: Carb modified    Allergies:   Allergies  Allergen Reactions  . Iodine Solution [Povidone Iodine] Rash     Discharge Medications:   Medication List    TAKE these medications        albuterol 108 (90 BASE) MCG/ACT inhaler  Commonly known as:  PROVENTIL HFA;VENTOLIN HFA  Inhale 1 puff into the lungs every 6 (six) hours as needed for wheezing or shortness of breath (sometimes allergy).     aspirin 81 MG EC tablet  Take 1 tablet (81 mg total) by mouth daily.     atorvastatin 40 MG tablet  Commonly known as:  LIPITOR  Take 1 tablet (40 mg total) by mouth at bedtime.     azithromycin 500 MG tablet  Commonly known as:  ZITHROMAX  Take 1 tablet (500 mg total) by mouth daily. x2days     budesonide-formoterol 160-4.5 MCG/ACT inhaler  Commonly known as:  SYMBICORT  Inhale 1 puff into the lungs 2 (two) times daily.     carvedilol 3.125 MG tablet  Commonly known as:  COREG  Take 1 tablet (3.125 mg total) by mouth 2 (two) times daily with a meal.     cefUROXime 500 MG tablet  Commonly known as:  CEFTIN  Take 1 tablet (500 mg  total) by mouth 2 (two) times daily with a meal. X 2 days     digoxin 0.25 MG tablet  Commonly known as:  LANOXIN  Take 1 tablet (0.25 mg total) by mouth daily.     docusate sodium 100 MG capsule  Commonly known as:  COLACE  Take 1 capsule (100 mg total) by mouth 2 (two) times daily. constipation     furosemide 40 MG tablet  Commonly known as:  LASIX  Take 1 tablet (40 mg total) by mouth 2 (two) times daily.     lisinopril 5 MG tablet  Commonly known as:  PRINIVIL,ZESTRIL  Take 1 tablet (5 mg total) by mouth daily.     metFORMIN 500 MG tablet  Commonly known as:  GLUCOPHAGE  Take 1 tablet (500 mg total) by mouth daily with breakfast.     pantoprazole 40 MG tablet  Commonly known as:  PROTONIX  Take 1 tablet (40 mg total) by mouth daily.     Potassium Chloride ER 20 MEQ Tbcr  Take 20 mEq by mouth 2 (two) times daily.     simethicone 80 MG chewable tablet  Commonly known as:  MYLICON  Chew 1 tablet (80 mg total) by mouth every 6 (six) hours as needed for flatulence.     sitaGLIPtin 100 MG tablet  Commonly known as:  JANUVIA  Take 1 tablet (100 mg total) by mouth daily.  spironolactone 25 MG tablet  Commonly known as:  ALDACTONE  Take 0.5 tablets (12.5 mg total) by mouth daily.         Brief H and P: For complete details please refer to admission H and P, but in brief Patient is a 64 year old male with COPD, not on home O2, was in his usual state of health until 2-3 days prior to admission when he started developing pain in the left side of his chest, sharp without any radiation, 4/10 in intensity. Associated with shortness of breath and coughing with clear expectoration. He also noticed some difficulty while lying down. He denied any fevers per se. He noticed some leg swelling. Chest pain increased with coughing and deep breathing. Patient had cold-like symptoms earlier this month and his grandchildren had been sick. Patient also reported for past many months having  abdominal bloating and distention, worked up by his gastroenterologist, underwent a colonoscopy on Monday which apparently was unremarkable.  Hospital Course:   Acute combined systolic and diastolic CHF, moderate mitral regurgitation, chest pain - 2-D echo showed EF of 15-20% with diffuse hypokinesis, grade 3 diastolic dysfunction - Discussing this in detail with the patient, he reported that he had been getting fatigued since October of last year, with dyspnea on exertion, abdominal bloating, may have been related to CHF rather than GI (patient has been going through GI workup outpatient which has been negative so far). Cardiology was consulted. Patient was placed on Lasix, lisinopril, spironolactone, beta blocker and statin by cardiology. Patient underwent cardiac catheterization on 07/18/14 which showed minimal nonobstructive coronary artery disease but severe nonischemic cardiomyopathy of unclear etiology Per cardiology recommendations, continue to titrate meds will need to see response to medical therapy before considering CRT-D, LifeVest to be placed before discharge   CAP (community acquired pneumonia) with pleuritic chest pain - CT angiogram of the chest negative for any PE, patient was placed on IV Zithromax and Rocephin. He has been transitioned to oral Zithromax and Ceftin. He will continue Symbicort and albuterol inhaler as needed.   Sinus tachycardia - Placed on metoprolol   abdominal bloating with Abdominal pain, chronic, right upper quadrant -Patient denies endoscopy, has been having abdominal bloating with abdominal pain after eating for several months. Ultrasound of the abdomen showed no gallstones and no sonographic Murphy sign. CT chest showed minimal stranding noted about the gallbladder with question of mild pericholecystic fluid, question mild cholecystitis. Currently patient has no abdominal pain LFTs are normal - HIDA scan scan normal/negative.  - Continue PPI,  simethicone  Type 2 diabetes mellitus: New diagnosis. The patient reports that he had been checking his blood sugar at home and were running in 200s however he was not on any hypoglycemics. He was working on his diet and weight control. Hemoglobin A1c 9.5, patient was placed on sliding scale insulin while inpatient and started on DPPP-4 inhibitor, Januvia and low-dose metformin upon dc to improve insulin sensitivity.    Day of Discharge BP 97/69 mmHg  Pulse 92  Temp(Src) 98 F (36.7 C) (Oral)  Resp 16  Ht  (1.702 m)  Wt 89.132 kg (196 lb 8 oz)  BMI 30.77 kg/m2  SpO2 97%  Physical Exam: General: Alert and awake oriented x3 not in any acute distress. HEENT: anicteric sclera, pupils reactive to light and accommodation CVS: S1-S2 clear no murmur rubs or gallops Chest: clear to auscultation bilaterally, no wheezing rales or rhonchi Abdomen: soft nontender, nondistended, normal bowel sounds Extremities: no cyanosis, clubbing or  edema noted bilaterally Neuro: Cranial nerves II-XII intact, no focal neurological deficits   The results of significant diagnostics from this hospitalization (including imaging, microbiology, ancillary and laboratory) are listed below for reference.    LAB RESULTS: Basic Metabolic Panel:  Recent Labs Lab 07/17/14 0321 07/18/14 0357 07/18/14 1435  NA 136 133*  --   K 3.8 3.7  --   CL 95* 93*  --   CO2 36* 31  --   GLUCOSE 145* 152*  --   BUN 19 18  --   CREATININE 1.21 1.08 1.22  CALCIUM 8.8 8.9  --    Liver Function Tests:  Recent Labs Lab 07/13/14 0957  AST 19  ALT 19  ALKPHOS 58  BILITOT 2.0*  PROT 7.2  ALBUMIN 3.6    Recent Labs Lab 07/13/14 0957  LIPASE 43   No results for input(s): AMMONIA in the last 168 hours. CBC:  Recent Labs Lab 07/15/14 0446 07/18/14 1435  WBC 10.9* 7.7  HGB 15.0 17.9*  HCT 45.8 52.6*  MCV 95.0 92.8  PLT 243 246   Cardiac Enzymes:  Recent Labs Lab 07/13/14 2017 07/14/14 0120   TROPONINI <0.03 <0.03   BNP: Invalid input(s): POCBNP CBG:  Recent Labs Lab 07/19/14 0640 07/19/14 1131  GLUCAP 144* 164*    Significant Diagnostic Studies:  Dg Chest 2 View  07/13/2014   CLINICAL DATA:  64 year old male with shortness of breath cough and chest tightness for 2 days. Current history of COPD. Initial encounter.  EXAM: CHEST  2 VIEW  COMPARISON:  CT Abdomen and Pelvis 06/20/2014. Chest radiographs 06/18/2014 and earlier.  FINDINGS: Stable somewhat low lung volumes. Vague increased left mid lung opacity from the prior studies, but at the same time increased lower lobe opacity appears regressed. No pneumothorax. No pleural effusion identified. Stable cardiac size and mediastinal contours. Visualized tracheal air column is within normal limits. Underlying chronic increased interstitial markings. No acute osseous abnormality identified.  IMPRESSION: Vague increased left mid lung opacity compatible with acute pneumonia superimposed on chronic lung disease.  Post treatment radiographs recommended to document resolution.   Electronically Signed   By: Augusto Gamble M.D.   On: 07/13/2014 09:39   Ct Angio Chest Pe W/cm &/or Wo Cm  07/14/2014   CLINICAL DATA:  Chest pain, worsened with breathing or coughing. Shortness of breath. Initial encounter.  EXAM: CT ANGIOGRAPHY CHEST WITH CONTRAST  TECHNIQUE: Multidetector CT imaging of the chest was performed using the standard protocol during bolus administration of intravenous contrast. Multiplanar CT image reconstructions and MIPs were obtained to evaluate the vascular anatomy.  CONTRAST:  OMNIPAQUE IOHEXOL 350 MG/ML SOLN  COMPARISON:  Chest radiograph performed 07/13/2014  FINDINGS: There is no evidence of pulmonary embolus.  Small bilateral pleural effusions are noted, with increased interstitial prominence, likely reflecting mild pulmonary edema. The effusion is slightly loculated on the left side. A large 1.5 cm calcified granuloma is again  noted at the left lung base. There is no evidence of pneumothorax. No masses are identified; no abnormal focal contrast enhancement is seen.  There is minimal aneurysmal dilatation of the ascending thoracic aorta, measuring 4.0 cm in AP dimension, and mild aneurysmal dilatation of the proximal aortic arch, measuring 3.8 cm in diameter. Scattered coronary artery calcifications are seen. No mediastinal lymphadenopathy is appreciated. No pericardial effusion is identified. Mild scattered calcification is noted along the proximal great vessels. No axillary lymphadenopathy is seen. The thyroid gland is diminutive and unremarkable in appearance.  The visualized portions of the liver and spleen are unremarkable. Minimal stranding is noted about the gallbladder, with question of mild pericholecystic fluid. Would correlate for symptoms of mild acute cholecystitis. The visualized portions of the pancreas, adrenal glands and kidneys are grossly unremarkable, aside from mild nonspecific perinephric stranding.  No acute osseous abnormalities are seen.  Review of the MIP images confirms the above findings.  IMPRESSION: 1. No evidence of pulmonary embolus. 2. Small bilateral pleural effusions, with increased interstitial prominence, likely reflecting mild pulmonary edema. The left-sided pleural effusion is slightly loculated. 3. Minimal soft tissue stranding about the gallbladder, with suggestion of mild pericholecystic fluid. Would correlate for symptoms of mild acute cholecystitis. 4. Minimal aneurysmal dilatation of the ascending thoracic aorta, measuring up to 4.0 cm in AP dimension, and mild aneurysmal dilatation of the proximal aortic arch, measuring 3.8 cm in diameter. Recommend annual imaging followup by CTA or MRA. This recommendation follows 2010 ACCF/AHA/AATS/ACR/ASA/SCA/SCAI/SIR/STS/SVM Guidelines for the Diagnosis and Management of Patients with Thoracic Aortic Disease. Circulation.2010; 121: e266-e369 5. Scattered  coronary artery calcifications seen.   Electronically Signed   By: Roanna Raider M.D.   On: 07/14/2014 02:18   Nm Hepatobiliary Including Gb  07/14/2014   CLINICAL DATA:  Bloating and abdominal pressure sensation, suspect mild cholecystitis  EXAM: NUCLEAR MEDICINE HEPATOBILIARY IMAGING WITH GALLBLADDER EF  TECHNIQUE: Sequential images of the abdomen were obtained out to 60 minutes following intravenous administration of radiopharmaceutical. After slow intravenous infusion of 1.9 micrograms Cholecystokinin, gallbladder ejection fraction was determined.  RADIOPHARMACEUTICALS:  1.0 Millicurie Tc-19m Choletec  COMPARISON:  Abdominal ultrasound dated July 13, 2014  FINDINGS: There is adequate uptake of the radiopharmaceutical by the liver. The gallbladder is visualized by 20 min. Bowel activity is also evident by 15-20 min. The 30 min gallbladder ejection fraction is 79%. At 30 min, normal ejection fraction is greater than 30%.  The patient did not experience symptoms during CCK infusion.  IMPRESSION: Normal hepatobiliary scan and normal gallbladder ejection fraction.   Electronically Signed   By: David  [place]   On: 07/14/2014 13:59   US Abdomen Complete  07/13/2014   CLINICAL DATA:  64 year old male with 1 day history of right upper quadrant pain  EXAM: ULTRASOUND ABDOMEN COMPLETE  COMPARISON:  None.  FINDINGS: Gallbladder: No gallstones or wall thickening visualized. No sonographic Murphy sign noted.  Common bile duct: Diameter: Within normal limits at 5 mm  Liver: No focal lesion identified. Within normal limits in parenchymal echogenicity. The main portal vein is patent with normal hepatopetal flow.  IVC: No abnormality visualized.  Pancreas: Visualized portion unremarkable. The tail is largely obscured by overlying bowel gas.  Spleen: Size and appearance within normal limits.  Right Kidney: Length: 13.2 cm. Echogenicity within normal limits. No mass or hydronephrosis visualized.  Left Kidney: Length: 13.6  cm. Echogenicity within normal limits. No mass or hydronephrosis visualized.  Abdominal aorta: Largely obscured by bowel gas. However, recent CTA of the abdomen and pelvis demonstrates a normal caliber aorta.  Other findings:  Small right-sided pleural effusion.  IMPRESSION: 1. No acute abnormality in the abdomen to explain the patient's clinical symptoms. 2. Small right-sided pleural effusion noted incidentally.   Electronically Signed   By: Malachy Moan M.D.   On: 07/13/2014 11:19    2D ECHO:  Study Conclusions  - Left ventricle: The cavity size was moderately dilated. Systolic function was severely reduced. The estimated ejection fraction was in the range of 15% to 20%. Diffuse hypokinesis. Doppler parameters  are consistent with a restrictive pattern, indicative of decreased left ventricular diastolic compliance and/or increased left atrial pressure (grade 3 diastolic dysfunction). Doppler parameters are consistent with elevated ventricular end-diastolic filling pressure. - Aortic valve: Trileaflet; normal thickness leaflets. There was no regurgitation. - Mitral valve: Significant mitral annular calcifications, predominantly posterior. Mobility was mildly restricted. There was moderate regurgitation directed centrally. - Left atrium: The atrium was moderately dilated. - Right ventricle: The cavity size was mildly dilated. Wall thickness was normal. Systolic function was moderately reduced. - Tricuspid valve: There was mild regurgitation. - Pulmonary arteries: Systolic pressure was moderately increased. PA peak pressure: 52 mm Hg (S). - Pericardium, extracardiac: There was no pericardial effusion Disposition and Follow-up:     Discharge Instructions    (HEART FAILURE PATIENTS) Call MD:  Anytime you have any of the following symptoms: 1) 3 pound weight gain in 24 hours or 5 pounds in 1 week 2) shortness of breath, with or without a dry hacking cough 3)  swelling in the hands, feet or stomach 4) if you have to sleep on extra pillows at night in order to breathe.    Complete by:  As directed      Diet - low sodium heart healthy    Complete by:  As directed      Diet Carb Modified    Complete by:  As directed      Increase activity slowly    Complete by:  As directed             DISPOSITION: home   DISCHARGE FOLLOW-UP Follow-up Information    Follow up with SHAW,KIMBERLEE, MD. Schedule an appointment as soon as possible for a visit in 10 days.   Specialty:  Family Medicine   Why:  office will call back with appointment  please bring your log of blood sugars for your PCP to review    Contact information:   [address] [place] [address] [phone]       Follow up with Arvilla Meres, MD On 07/25/2014.   Specialty:  Cardiology   Why:  @11 :15 Gate code 0700 spoke with [employer] information:   [address] [place] [address] [phone]        Time spent on Discharge: 40 mins  Signed:   RAI,RIPUDEEP M.D. [facility] 07/19/2014, 11:56 AM Pager: [phone]

## 2014-07-19 NOTE — Progress Notes (Signed)
Pt to be discharged via wheelchair, escorted by staff, accompanied by wife. Discharge instructions provided to pt and his wife. Verbalize understanding, both able to verbalize teach back appropriately. PIV d/c'd, no s/s of complications. Tele removed and returned to unit. Lifevest in place. Deny needs or questions.

## 2014-07-22 ENCOUNTER — Telehealth (HOSPITAL_COMMUNITY): Payer: Self-pay | Admitting: Surgery

## 2014-07-22 NOTE — Telephone Encounter (Signed)
[facility] Follow-up Telephone Call  I called to check on [name] following his recent hospitalization.  He reports that things are going "well".  And that his weights have been "around 195 lbs" --he was 196.5 lbs at the time of discharge.  He claims to be following a low sodium diet and taking all medications as prescribed at discharge.  His only complaint was having trouble falling asleep at night.  I encouraged him to call PCP for sleep medication if he continues to be bothered with it.  We reviewed his follow-up appt scheduled for 07/25/14 at 11:20a in the A[facility] clinic.

## 2014-07-25 ENCOUNTER — Ambulatory Visit (HOSPITAL_COMMUNITY)
Admit: 2014-07-25 | Discharge: 2014-07-25 | Disposition: A | Discharge status: Home / Self Care | Payer: BLUE CROSS/BLUE SHIELD | Source: Ambulatory Visit | Attending: Internal Medicine | Admitting: Internal Medicine

## 2014-07-25 DIAGNOSIS — E669 Obesity, unspecified: Secondary | ICD-10-CM | POA: Insufficient documentation

## 2014-07-25 DIAGNOSIS — Z79899 Other long term (current) drug therapy: Secondary | ICD-10-CM | POA: Insufficient documentation

## 2014-07-25 DIAGNOSIS — Z7982 Long term (current) use of aspirin: Secondary | ICD-10-CM | POA: Diagnosis not present

## 2014-07-25 DIAGNOSIS — I5022 Chronic systolic (congestive) heart failure: Secondary | ICD-10-CM

## 2014-07-25 DIAGNOSIS — I429 Cardiomyopathy, unspecified: Secondary | ICD-10-CM | POA: Insufficient documentation

## 2014-07-25 DIAGNOSIS — J449 Chronic obstructive pulmonary disease, unspecified: Secondary | ICD-10-CM | POA: Insufficient documentation

## 2014-07-25 DIAGNOSIS — E119 Type 2 diabetes mellitus without complications: Secondary | ICD-10-CM | POA: Insufficient documentation

## 2014-07-25 DIAGNOSIS — Z87891 Personal history of nicotine dependence: Secondary | ICD-10-CM | POA: Insufficient documentation

## 2014-07-25 DIAGNOSIS — Z9981 Dependence on supplemental oxygen: Secondary | ICD-10-CM | POA: Diagnosis not present

## 2014-07-25 LAB — BASIC METABOLIC PANEL
Anion gap: 10 (ref 5–15)
BUN: 29 mg/dL — AB (ref 6–23)
CHLORIDE: 93 mmol/L — AB (ref 96–112)
CO2: 28 mmol/L (ref 19–32)
CREATININE: 1.33 mg/dL (ref 0.50–1.35)
Calcium: 9.4 mg/dL (ref 8.4–10.5)
GFR calc Af Amer: 64 mL/min — ABNORMAL LOW (ref >90)
GFR, EST NON AFRICAN AMERICAN: 55 mL/min — AB (ref >90)
Glucose, Bld: 131 mg/dL — ABNORMAL HIGH (ref 70–99)
Potassium: 5.3 mmol/L — ABNORMAL HIGH (ref 3.5–5.1)
Sodium: 131 mmol/L — ABNORMAL LOW (ref 135–145)

## 2014-07-25 LAB — BRAIN NATRIURETIC PEPTIDE: B NATRIURETIC PEPTIDE 5: 113.4 pg/mL — AB (ref 0.0–100.0)

## 2014-07-25 LAB — DIGOXIN LEVEL: Digoxin Level: 1.7 ng/mL (ref 0.8–2.0)

## 2014-07-25 MED ORDER — FUROSEMIDE 40 MG PO TABS: 40.0000 mg | ORAL_TABLET | Freq: Every day | ORAL | Status: DC

## 2014-07-25 MED ORDER — POTASSIUM CHLORIDE ER 20 MEQ PO TBCR: 20.0000 meq | EXTENDED_RELEASE_TABLET | Freq: Every day | ORAL | Status: DC

## 2014-07-25 NOTE — Progress Notes (Signed)
Patient ID: [name], male   DOB: Nov 02, 1950, 64 y.o.   MRN: [id number] PCP: Primary Cardiologist:   HPI: [name] is a 64 year old with history of NICM, chronic systolic heart failure,COPD on home oxygen, DM II,  recently admitted with  chest pain and volume overload. Had an ECHO that showed EF 20-25%. Underwent cath as noted below. He was discharged on low dose bb, ace, dig, lasix, and spiro.   He returns for post hospital follow up. Overall feels ok. Denies SOB/PND/Orthopnea. Mild dyspnea with steps. SBP at home 80-90s. Weight at  Home 195 pounds. Wearing Life Vest. Taking all medications.    RHC/LHC 07/18/2014 RA: 5 RV: 29/8/9 PA: 30/11 (21) PCWP: 16 Fick CO/CI: 6.0/3.0 PVR: 0.8 WU SVR: 1042 Ao sat: 94% PA sats: 73%, 74% SVC sat: 72% Large vessel. Wraps apex. Small D1. Large D2. 20-30% plaque in proximal and distal LAD and D2  ECHO 07/15/14 EF 20-25% Grade III DD  Labs 07/13/2014 BNP 877 Labs 07/19/2014 K 4.5 Creatinine 1.12   Social History: He lives in [place] with his wife. Quit smoking many years ago. No alcohol use. No illicit drug use. Usually independent with daily activities.  Family History: He denies any health problems in his family. ROS: All systems negative except as listed in HPI, PMH and Problem List.  SH:  History   Social History  . Marital Status: Married    Spouse Name: N/A    Number of Children: N/A  . Years of Education: N/A   Occupational History  . Not on file.   Social History Main Topics  . Smoking status: Former Smoker    Start date: 10/24/2010  . Smokeless tobacco: Not on file  . Alcohol Use: Yes  . Drug Use: No  . Sexual Activity: Not on file   Other Topics Concern  . Not on file   Social History Narrative    FH:  Family History  Problem Relation Age of Onset  . Diabetes Father     Past Medical History  Diagnosis Date  . COPD (chronic obstructive pulmonary disease)   . Obesity   . Diabetes mellitus, type II   .  Chronic systolic CHF (congestive heart failure)     Current Outpatient Prescriptions  Medication Sig Dispense Refill  . albuterol (PROVENTIL HFA;VENTOLIN HFA) 108 (90 BASE) MCG/ACT inhaler Inhale 1 puff into the lungs every 6 (six) hours as needed for wheezing or shortness of breath (sometimes allergy).    Marland Kitchen aspirin EC 81 MG EC tablet Take 1 tablet (81 mg total) by mouth daily. 30 tablet 3  . budesonide-formoterol (SYMBICORT) 160-4.5 MCG/ACT inhaler Inhale 1 puff into the lungs 2 (two) times daily.    . carvedilol (COREG) 3.125 MG tablet Take 1 tablet (3.125 mg total) by mouth 2 (two) times daily with a meal. 60 tablet 3  . digoxin (LANOXIN) 0.25 MG tablet Take 1 tablet (0.25 mg total) by mouth daily. 30 tablet 4  . docusate sodium (COLACE) 100 MG capsule Take 1 capsule (100 mg total) by mouth 2 (two) times daily. constipation 60 capsule 1  . furosemide (LASIX) 40 MG tablet Take 1 tablet (40 mg total) by mouth 2 (two) times daily. 60 tablet 1  . lisinopril (PRINIVIL,ZESTRIL) 5 MG tablet Take 1 tablet (5 mg total) by mouth daily. 30 tablet 4  . metFORMIN (GLUCOPHAGE) 500 MG tablet Take 1 tablet (500 mg total) by mouth daily with breakfast. 30 tablet 3  . pantoprazole (  PROTONIX) 40 MG tablet Take 1 tablet (40 mg total) by mouth daily. 30 tablet 3  . potassium chloride 20 MEQ TBCR Take 20 mEq by mouth 2 (two) times daily. 60 tablet 3  . simethicone (MYLICON) 80 MG chewable tablet Chew 1 tablet (80 mg total) by mouth every 6 (six) hours as needed for flatulence. 30 tablet 0  . sitaGLIPtin (JANUVIA) 100 MG tablet Take 1 tablet (100 mg total) by mouth daily. 30 tablet 3  . spironolactone (ALDACTONE) 25 MG tablet Take 0.5 tablets (12.5 mg total) by mouth daily. 30 tablet 3  . atorvastatin (LIPITOR) 40 MG tablet Take 1 tablet (40 mg total) by mouth at bedtime. (Patient not taking: Reported on 07/25/2014) 30 tablet 3   No current facility-administered medications for this encounter.    Filed Vitals:    07/25/14 1115  BP: 84/66  Pulse: 88  Weight: 200 lb 6.4 oz (90.901 kg)  SpO2: 95%    PHYSICAL EXAM:  General:  Well appearing. No resp difficulty HEENT: normal Neck: supple. JVP flat. Carotids 2+ bilaterally; no bruits. No lymphadenopathy or thryomegaly appreciated. Cor: PMI normal. Regular rate & rhythm. No rubs, gallops or murmurs. Lungs: clear Abdomen: soft, nontender, nondistended. No hepatosplenomegaly. No bruits or masses. Good bowel sounds. Extremities: no cyanosis, clubbing, rash, edema Neuro: alert & orientedx3, cranial nerves grossly intact. Moves all 4 extremities w/o difficulty. Affect pleasant.     ASSESSMENT & PLAN: 1. Chronic Systolic HF. NICM Had cath 07/18/2014 .  NYHA II-III. Volume status stable and a little low. Cut back lasix to 40 mg once daily with an extra 40 mg of lasix for weight 199 or greater. Cut back potassium to 20 meq once a day.  BP soft. Continue current dose of carvedilol, lisinopril, spiro and dig.  Reinforced daily weights, low salt food choices, and limiting fluid to less than 2 liters per day.  Plan to repeat ECHO in 3 months after HF meds optimized. Continue Lifevest  Until repeat ECHO.   2 . DM II- Hgb 9.5  Per PCP .Continue current regimen.   Follow up in 3 weeks.   CLEGG,AMY   Patient seen and examined with Tonye Becket, NP. We discussed all aspects of the encounter. I agree with the assessment and plan as stated above.   Doing well. Appears dry. Cut lasix to 40 daily. Take extra lasix for weight 199 or greater. Continue LifeVest. BP too soft to titrate HF meds.   Truman Hayward 5:38 PM

## 2014-07-25 NOTE — Patient Instructions (Signed)
Decrease Furosemide (Lasix) to 40 mg daily, take an extra 40 mg if weight is 199 lb or greater  Decrease Potassium (k-dur) to 20 meq daily  Labs today  Your physician recommends that you schedule a follow-up appointment in: 3 weeks

## 2014-07-28 ENCOUNTER — Telehealth (HOSPITAL_COMMUNITY): Payer: Self-pay | Admitting: Vascular Surgery

## 2014-07-28 NOTE — Telephone Encounter (Signed)
Nurse Case manager [employer] called to speak with a nurse Herbert Seta) about pt life vest .. PLEASE ADVISE

## 2014-08-09 ENCOUNTER — Telehealth (HOSPITAL_COMMUNITY): Payer: Self-pay | Admitting: Vascular Surgery

## 2014-08-09 NOTE — Telephone Encounter (Signed)
Pt wife called she would like to speak to Herbert Seta about the status of [employer] about the life vest,he is done with Simethicone wife want to know if he needs to get a refill, and she is also having problems with downloading his life vest transmissions.. Please advise

## 2014-08-12 NOTE — Telephone Encounter (Signed)
Am working on appeal for life vest as [employer] has denied, left VM on [name]'s ID VM that this is in process c/b for more questions

## 2014-08-12 NOTE — Telephone Encounter (Signed)
Have spoken w/Anda and will appeal denial of life vest, she provided with number to call for peer to peer [phone] ext [phone])

## 2014-08-16 ENCOUNTER — Encounter (HOSPITAL_COMMUNITY): Payer: Self-pay

## 2014-08-16 ENCOUNTER — Ambulatory Visit (HOSPITAL_COMMUNITY)
Admission: RE | Admit: 2014-08-16 | Discharge: 2014-08-16 | Disposition: A | Discharge status: Home / Self Care | Payer: BLUE CROSS/BLUE SHIELD | Source: Ambulatory Visit | Attending: Cardiology | Admitting: Cardiology

## 2014-08-16 VITALS — BP 102/66 | HR 104 | Wt 205.5 lb

## 2014-08-16 DIAGNOSIS — J449 Chronic obstructive pulmonary disease, unspecified: Secondary | ICD-10-CM | POA: Insufficient documentation

## 2014-08-16 DIAGNOSIS — Z87891 Personal history of nicotine dependence: Secondary | ICD-10-CM | POA: Insufficient documentation

## 2014-08-16 DIAGNOSIS — Z7982 Long term (current) use of aspirin: Secondary | ICD-10-CM | POA: Insufficient documentation

## 2014-08-16 DIAGNOSIS — E119 Type 2 diabetes mellitus without complications: Secondary | ICD-10-CM | POA: Diagnosis not present

## 2014-08-16 DIAGNOSIS — Z79899 Other long term (current) drug therapy: Secondary | ICD-10-CM | POA: Insufficient documentation

## 2014-08-16 DIAGNOSIS — Z9981 Dependence on supplemental oxygen: Secondary | ICD-10-CM | POA: Diagnosis not present

## 2014-08-16 DIAGNOSIS — I5022 Chronic systolic (congestive) heart failure: Secondary | ICD-10-CM | POA: Insufficient documentation

## 2014-08-16 LAB — BASIC METABOLIC PANEL
Anion gap: 11 (ref 5–15)
BUN: 17 mg/dL (ref 6–23)
CO2: 27 mmol/L (ref 19–32)
Calcium: 9.5 mg/dL (ref 8.4–10.5)
Chloride: 92 mmol/L — ABNORMAL LOW (ref 96–112)
Creatinine, Ser: 1.07 mg/dL (ref 0.50–1.35)
GFR calc Af Amer: 83 mL/min — ABNORMAL LOW (ref >90)
GFR, EST NON AFRICAN AMERICAN: 71 mL/min — AB (ref >90)
Glucose, Bld: 273 mg/dL — ABNORMAL HIGH (ref 70–99)
POTASSIUM: 4.5 mmol/L (ref 3.5–5.1)
SODIUM: 130 mmol/L — AB (ref 135–145)

## 2014-08-16 MED ORDER — DIGOXIN 125 MCG PO TABS: 0.1250 mg | ORAL_TABLET | Freq: Every day | ORAL | Status: DC

## 2014-08-16 MED ORDER — LISINOPRIL 5 MG PO TABS: 2.5000 mg | ORAL_TABLET | Freq: Every evening | ORAL | Status: DC

## 2014-08-16 NOTE — Patient Instructions (Signed)
CHANGE Digoxin to 0.125 mg daily DECREASE Lisinopril to 2.5 mg, one half tab every EVENING STOP Potassium  Labs today  Your physician has recommended that you have a cardiopulmonary stress test (CPX). CPX testing is a non-invasive measurement of heart and lung function. It replaces a traditional treadmill stress test. This type of test provides a tremendous amount of information that relates not only to your present condition but also for future outcomes. This test combines measurements of you ventilation, respiratory gas exchange in the lungs, electrocardiogram (EKG), blood pressure and physical response before, during, and following an exercise protocol.  Your physician recommends that you schedule a follow-up appointment in: 10 days  Do the following things EVERYDAY: 1) Weigh yourself in the morning before breakfast. Write it down and keep it in a log. 2) Take your medicines as prescribed 3) Eat low salt foods-Limit salt (sodium) to 2000 mg per day.  4) Stay as active as you can everyday 5) Limit all fluids for the day to less than 2 liters 6)

## 2014-08-16 NOTE — Progress Notes (Signed)
Patient ID: [name], male   DOB: 09-14-1950, 64 y.o.   MRN: [id number] PCP: Dr. Pincus Badder  HPI: [name] is a 64 year old with history of nonischemic cardiomyopathy, chronic systolic heart failure, COPD on home oxygen, DM II, and admission in 1/16 with chest pain and volume overload. Had an ECHO that showed EF 20-25%. Underwent cath as noted below. He was discharged on low dose bb, ace, dig, lasix, and spiro.   Breathing has been doing well.  He is no longer short of breath.  He can walk up a flight of steps.  He feels the best he has felt in 6-8 months in terms of breathing.  His main complaint today is generalized fatigue and lightheadedness with standing.  He says that he is fatigued when his BP runs low.  He checks BP at home, and SBP frequently runs in the 80s.  Today, it is 102/66. He weighs daily and will occasionally take an extra 40 mg Lasix in the evening.  He has a Lifevest on but [employer] is refusing to cover it.     RHC/LHC 07/18/2014 RA: 5 RV: 29/8/9 PA: 30/11 (21) PCWP: 16 Fick CO/CI: 6.0/3.0 PVR: 0.8 WU SVR: 1042 Ao sat: 94% PA sats: 73%, 74% SVC sat: 72% Large vessel. Wraps apex. Small D1. Large D2. 20-30% plaque in proximal and distal LAD and D2  ECHO 07/15/14 EF 20-25% Grade III DD  Labs 07/13/2014 BNP 877 Labs 07/19/2014 K 4.5 Creatinine 1.12  Labs 07/25/14 K 5.3, creatinine 1.33, digoxin 1.7  Social History: He lives in [place] with his wife. Quit smoking many years ago. No alcohol use. No illicit drug use. Usually independent with daily activities.  Family History: No history of cardiomyopathy or sudden death.   ROS: All systems negative except as listed in HPI, PMH and Problem List.  SH:  History   Social History  . Marital Status: Married    Spouse Name: N/A  . Number of Children: N/A  . Years of Education: N/A   Occupational History  . Not on file.   Social History Main Topics  . Smoking status: Former Smoker    Start date: 10/24/2010  . Smokeless  tobacco: Not on file  . Alcohol Use: Yes  . Drug Use: No  . Sexual Activity: Not on file   Other Topics Concern  . Not on file   Social History Narrative    FH:  Family History  Problem Relation Age of Onset  . Diabetes Father     Past Medical History  Diagnosis Date  . COPD (chronic obstructive pulmonary disease)   . Obesity   . Diabetes mellitus, type II   . Chronic systolic CHF (congestive heart failure)     Current Outpatient Prescriptions  Medication Sig Dispense Refill  . albuterol (PROVENTIL HFA;VENTOLIN HFA) 108 (90 BASE) MCG/ACT inhaler Inhale 1 puff into the lungs every 6 (six) hours as needed for wheezing or shortness of breath (sometimes allergy).    Marland Kitchen aspirin EC 81 MG EC tablet Take 1 tablet (81 mg total) by mouth daily. 30 tablet 3  . carvedilol (COREG) 3.125 MG tablet Take 1 tablet (3.125 mg total) by mouth 2 (two) times daily with a meal. 60 tablet 3  . digoxin (LANOXIN) 0.125 MG tablet Take 1 tablet (0.125 mg total) by mouth daily. 30 tablet 3  . docusate sodium (COLACE) 100 MG capsule Take 1 capsule (100 mg total) by mouth 2 (two) times daily. constipation 60  capsule 1  . furosemide (LASIX) 40 MG tablet Take 1 tablet (40 mg total) by mouth daily. Take an extra 40 mg for weight 199 lb or greater 60 tablet 1  . lisinopril (PRINIVIL,ZESTRIL) 5 MG tablet Take 0.5 tablets (2.5 mg total) by mouth every evening. 15 tablet 4  . metFORMIN (GLUCOPHAGE) 500 MG tablet Take 1 tablet (500 mg total) by mouth daily with breakfast. 30 tablet 3  . pantoprazole (PROTONIX) 40 MG tablet Take 1 tablet (40 mg total) by mouth daily. 30 tablet 3  . simethicone (MYLICON) 80 MG chewable tablet Chew 1 tablet (80 mg total) by mouth every 6 (six) hours as needed for flatulence. 30 tablet 0  . sitaGLIPtin (JANUVIA) 100 MG tablet Take 1 tablet (100 mg total) by mouth daily. 30 tablet 3  . spironolactone (ALDACTONE) 25 MG tablet Take 0.5 tablets (12.5 mg total) by mouth daily. 30 tablet 3    No current facility-administered medications for this encounter.    Filed Vitals:   08/16/14 0906  BP: 102/66  Pulse: 104  Weight: 205 lb 8 oz (93.214 kg)  SpO2: 94%    PHYSICAL EXAM:  General:  Well appearing. No resp difficulty HEENT: normal Neck: supple. JVP flat. Carotids 2+ bilaterally; no bruits. No lymphadenopathy or thryomegaly appreciated. Cor: PMI normal. Regular rate & rhythm. No rubs, gallops or murmurs. Lungs: clear Abdomen: soft, nontender, nondistended. No hepatosplenomegaly. No bruits or masses. Good bowel sounds. Extremities: no cyanosis, clubbing, rash, edema Neuro: alert & orientedx3, cranial nerves grossly intact. Moves all 4 extremities w/o difficulty. Affect pleasant.  ASSESSMENT & PLAN: 1. Chronic Systolic HF: Nonischemic cardiomyopathy, had cath 07/18/2014.  NYHA class II symptoms.  He looks euvolemic.  Main problem appears to stem from BP running too low at times (orthostasis and fatigue).  - Continue current Lasix 40 mg daily and spironolactone 12.5 mg daily.   - Continue Coreg 3.125 mg bid.  - High digoxin level with recent labs, decrease digoxin to 0.125 mg daily and check level today.  - High K level on recent labs, stop KCl supplement.  - Decrease lisinopril to 2.5 mg daily and take in evening rather in the morning.  - BMET today.  - I will arrange for CPX.  - Repeat echo in 8/16 to decide on ICD placement. QRS not wide so will likely not be CRT candidate.  - He is not going to get covered for Lifevest. He has had some PVCs but no NSVT.  He is going to have to send the vest back if we cannot get coverage, will see if there is anything that we can do.  2 . DM II: Control still not ideal.  Followup with PCP.   Follow up in 10 days.   Marca Ancona 08/16/2014

## 2014-08-25 ENCOUNTER — Encounter (HOSPITAL_COMMUNITY): Payer: Self-pay

## 2014-08-25 ENCOUNTER — Ambulatory Visit (HOSPITAL_COMMUNITY)
Admission: RE | Admit: 2014-08-25 | Discharge: 2014-08-25 | Disposition: A | Discharge status: Home / Self Care | Payer: BLUE CROSS/BLUE SHIELD | Source: Ambulatory Visit | Attending: Internal Medicine | Admitting: Internal Medicine

## 2014-08-25 VITALS — BP 114/62 | HR 100 | Wt 204.0 lb

## 2014-08-25 DIAGNOSIS — J449 Chronic obstructive pulmonary disease, unspecified: Secondary | ICD-10-CM | POA: Insufficient documentation

## 2014-08-25 DIAGNOSIS — Z87891 Personal history of nicotine dependence: Secondary | ICD-10-CM | POA: Insufficient documentation

## 2014-08-25 DIAGNOSIS — Z7982 Long term (current) use of aspirin: Secondary | ICD-10-CM | POA: Diagnosis not present

## 2014-08-25 DIAGNOSIS — Z79899 Other long term (current) drug therapy: Secondary | ICD-10-CM | POA: Diagnosis not present

## 2014-08-25 DIAGNOSIS — I5022 Chronic systolic (congestive) heart failure: Secondary | ICD-10-CM | POA: Diagnosis not present

## 2014-08-25 DIAGNOSIS — Z9981 Dependence on supplemental oxygen: Secondary | ICD-10-CM | POA: Insufficient documentation

## 2014-08-25 DIAGNOSIS — E119 Type 2 diabetes mellitus without complications: Secondary | ICD-10-CM | POA: Insufficient documentation

## 2014-08-25 MED ORDER — CARVEDILOL 3.125 MG PO TABS: ORAL_TABLET | ORAL | Status: DC

## 2014-08-25 MED ORDER — DIGOXIN 62.5 MCG PO TABS: 0.0625 mg | ORAL_TABLET | Freq: Every day | ORAL | Status: DC

## 2014-08-25 NOTE — Progress Notes (Signed)
Patient ID: [name], male   DOB: 1950/12/23, 64 y.o.   MRN: [id number] PCP: Primary Cardiologist:   HPI: Mr Edgecombe is a 64 year old with history of NICM, chronic systolic heart failure,COPD on home oxygen, DM II,  recently admitted with  chest pain and volume overload. Had an ECHO that showed EF 20-25%. Underwent cath as noted below. He was discharged on low dose bb, ace, dig, lasix, and spiro.   He returns for follow up. Last visit lasix was cut back. He takes an extra lasix for weight greater than 199 pounds.which is about 2 days a week. Has bendopnea. Denies SOB/PND/Orthopnea. Weight at home 198-200 pounds. Taking all medications. 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 SBP at home 100-120.   RHC/LHC 07/18/2014 RA: 5 RV: 29/8/9 PA: 30/11 (21) PCWP: 16 Fick CO/CI: 6.0/3.0 PVR: 0.8 WU SVR: 1042 Ao sat: 94% PA sats: 73%, 74% SVC sat: 72% Large vessel. Wraps apex. Small D1. Large D2. 20-30% plaque in proximal and distal LAD and D2  ECHO 07/15/14 EF 20-25% Grade III DD  Labs 07/13/2014 BNP 877 Labs 07/19/2014 K 4.5 Creatinine 1.12 Labs 07/25/2014 Dig level 1.7./   Labs 08/16/2014 K 4.5 Creatinine 1.07    Social History: He lives in [place] with his wife. Quit smoking many years ago. No alcohol use. No illicit drug use. Usually independent with daily activities.  Family History: He denies any health problems in his family. ROS: All systems negative except as listed in HPI, PMH and Problem List.  SH:  History   Social History  . Marital Status: Married    Spouse Name: N/A  . Number of Children: N/A  . Years of Education: N/A   Occupational History  . Not on file.   Social History Main Topics  . Smoking status: Former Smoker    Start date: 10/24/2010  . Smokeless tobacco: Not on file  . Alcohol Use: Yes  . Drug Use: No  . Sexual Activity: Not on file   Other Topics Concern  . Not on file   Social History Narrative    FH:  Family History  Problem  Relation Age of Onset  . Diabetes Father     Past Medical History  Diagnosis Date  . COPD (chronic obstructive pulmonary disease)   . Obesity   . Diabetes mellitus, type II   . Chronic systolic CHF (congestive heart failure)     Current Outpatient Prescriptions  Medication Sig Dispense Refill  . albuterol (PROVENTIL HFA;VENTOLIN HFA) 108 (90 BASE) MCG/ACT inhaler Inhale 1 puff into the lungs every 6 (six) hours as needed for wheezing or shortness of breath (sometimes allergy).    Marland Kitchen aspirin EC 81 MG EC tablet Take 1 tablet (81 mg total) by mouth daily. 30 tablet 3  . carvedilol (COREG) 3.125 MG tablet Take 1 tablet (3.125 mg total) by mouth 2 (two) times daily with a meal. 60 tablet 3  . digoxin (LANOXIN) 0.125 MG tablet Take 1 tablet (0.125 mg total) by mouth daily. 30 tablet 3  . docusate sodium (COLACE) 100 MG capsule Take 1 capsule (100 mg total) by mouth 2 (two) times daily. constipation 60 capsule 1  . furosemide (LASIX) 40 MG tablet Take 1 tablet (40 mg total) by mouth daily. Take an extra 40 mg for weight 199 lb or greater 60 tablet 1  . lisinopril (PRINIVIL,ZESTRIL) 5 MG tablet Take 0.5 tablets (2.5 mg total) by mouth every evening. 15 tablet 4  .  metFORMIN (GLUCOPHAGE) 500 MG tablet Take 1 tablet (500 mg total) by mouth daily with breakfast. 30 tablet 3  . pantoprazole (PROTONIX) 40 MG tablet Take 1 tablet (40 mg total) by mouth daily. 30 tablet 3  . simethicone (MYLICON) 80 MG chewable tablet Chew 1 tablet (80 mg total) by mouth every 6 (six) hours as needed for flatulence. 30 tablet 0  . sitaGLIPtin (JANUVIA) 100 MG tablet Take 1 tablet (100 mg total) by mouth daily. 30 tablet 3  . spironolactone (ALDACTONE) 25 MG tablet Take 0.5 tablets (12.5 mg total) by mouth daily. 30 tablet 3   No current facility-administered medications for this encounter.    Filed Vitals:   08/25/14 1359  BP: 114/62  Pulse: 100  Weight: 204 lb (92.534 kg)  SpO2: 96%    PHYSICAL  EXAM:  General:  Well appearing. No resp difficulty HEENT: normal Neck: supple. JVP flat. Carotids 2+ bilaterally; no bruits. No lymphadenopathy or thryomegaly appreciated. Cor: PMI normal. Regular rate & rhythm. No rubs, gallops or murmurs. Lungs: clear Abdomen: obese, soft, nontender, nondistended. No hepatosplenomegaly. No bruits or masses. Good bowel sounds. Extremities: no cyanosis, clubbing, rash, edema Neuro: alert & orientedx3, cranial nerves grossly intact. Moves all 4 extremities w/o difficulty. Affect pleasant.     ASSESSMENT & PLAN: 1. Chronic Systolic HF. NICM Had cath 07/18/2014 .  NYHA II-III. Volume status stable. Continue lasix to 40 mg once daily with an extra 40 mg of lasix for weight 199 or greater. Continue spiro 12.5 mg daily.   Continue  Carvedilol 3.125 mg in am and increase bed time to 6.25 mg in evening. Cut back digoxin to 0.0625 mg daily.  Continue lisinopril 2.5 mg in the evening.    Reinforced daily weights, low salt food choices, and limiting fluid to less than 2 liters per day.  Plan to repeat ECHO in 3 months after HF meds optimized. Likely early May.  Has CPX in the next couple of months.   2 . DM II- Hgb 9.5  Per PCP .Continue current regimen.   Follow up in 3 weeks and check BMEt and dig level.   Silvestre Mines

## 2014-08-25 NOTE — Patient Instructions (Signed)
DECREASE Digoxin to 0.0625mg  tablet once daily.  INCREASE Carvedilol (Coreg) to 3.125mg  in am and 6.25MG  IN PM.  Follow up 3-4 weeks with lab work.  Do the following things EVERYDAY: 1) Weigh yourself in the morning before breakfast. Write it down and keep it in a log. 2) Take your medicines as prescribed 3) Eat low salt foods-Limit salt (sodium) to 2000 mg per day.  4) Stay as active as you can everyday 5) Limit all fluids for the day to less than 2 liters

## 2014-08-31 ENCOUNTER — Other Ambulatory Visit (HOSPITAL_COMMUNITY): Payer: BLUE CROSS/BLUE SHIELD

## 2014-09-05 ENCOUNTER — Telehealth (HOSPITAL_COMMUNITY): Payer: Self-pay | Admitting: Vascular Surgery

## 2014-09-05 MED ORDER — CARVEDILOL 3.125 MG PO TABS: ORAL_TABLET | ORAL | Status: DC

## 2014-09-05 NOTE — Telephone Encounter (Signed)
Pt wife called pt needs a new prescription of Carvedilol sent to [facility].Marland Kitchen Please advise

## 2014-09-08 ENCOUNTER — Encounter (HOSPITAL_COMMUNITY): Payer: BLUE CROSS/BLUE SHIELD

## 2014-09-22 ENCOUNTER — Ambulatory Visit (HOSPITAL_COMMUNITY)
Admission: RE | Admit: 2014-09-22 | Discharge: 2014-09-22 | Disposition: A | Discharge status: Home / Self Care | Payer: BLUE CROSS/BLUE SHIELD | Source: Ambulatory Visit | Attending: Cardiology | Admitting: Cardiology

## 2014-09-22 ENCOUNTER — Encounter (HOSPITAL_COMMUNITY): Payer: Self-pay

## 2014-09-22 VITALS — BP 92/60 | HR 100 | Wt 207.4 lb

## 2014-09-22 DIAGNOSIS — E119 Type 2 diabetes mellitus without complications: Secondary | ICD-10-CM | POA: Diagnosis not present

## 2014-09-22 DIAGNOSIS — I5022 Chronic systolic (congestive) heart failure: Secondary | ICD-10-CM

## 2014-09-22 LAB — BASIC METABOLIC PANEL
Anion gap: 14 (ref 5–15)
BUN: 17 mg/dL (ref 6–23)
CO2: 25 mmol/L (ref 19–32)
Calcium: 9.2 mg/dL (ref 8.4–10.5)
Chloride: 94 mmol/L — ABNORMAL LOW (ref 96–112)
Creatinine, Ser: 1.14 mg/dL (ref 0.50–1.35)
GFR calc Af Amer: 77 mL/min — ABNORMAL LOW (ref >90)
GFR calc non Af Amer: 66 mL/min — ABNORMAL LOW (ref >90)
Glucose, Bld: 153 mg/dL — ABNORMAL HIGH (ref 70–99)
Potassium: 3.8 mmol/L (ref 3.5–5.1)
SODIUM: 133 mmol/L — AB (ref 135–145)

## 2014-09-22 LAB — DIGOXIN LEVEL: DIGOXIN LVL: 0.2 ng/mL — AB (ref 0.8–2.0)

## 2014-09-22 NOTE — Progress Notes (Signed)
Patient ID: [name], male   DOB: 05/20/1951, 64 y.o.   MRN: [id number] PCP: Primary Cardiologist: Dr Gala Romney  HPI: [name] is a 64 year old with history of NICM, chronic systolic heart failure,COPD on home oxygen, DM II,  recently admitted with  chest pain and volume overload. Had an ECHO that showed EF 20-25%. Underwent cath as noted below. He was discharged on low dose bb, ace, dig, lasix, and spiro.   He returns for follow up.Overall feeling pretty good.  Last visit time night time carvedilol increased 6.25 mg. Denies SOB/PND/Orthopnea. Weight at home 199-200 Taking all medications. 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 SBP at home 100-120.   RHC/LHC 07/18/2014 RA: 5 RV: 29/8/9 PA: 30/11 (21) PCWP: 16 Fick CO/CI: 6.0/3.0 PVR: 0.8 WU SVR: 1042 Ao sat: 94% PA sats: 73%, 74% SVC sat: 72% Large vessel. Wraps apex. Small D1. Large D2. 20-30% plaque in proximal and distal LAD and D2  ECHO 07/15/14 EF 20-25% Grade III DD  Labs 07/13/2014 BNP 877 Labs 07/19/2014 K 4.5 Creatinine 1.12 Labs 07/25/2014 Dig level 1.7./   Labs 08/16/2014 K 4.5 Creatinine 1.07    Social History: He lives in [place] with his wife. Quit smoking many years ago. No alcohol use. No illicit drug use. Usually independent with daily activities.  Family History: He denies any health problems in his family. ROS: All systems negative except as listed in HPI, PMH and Problem List.  SH:  History   Social History  . Marital Status: Married    Spouse Name: N/A  . Number of Children: N/A  . Years of Education: N/A   Occupational History  . Not on file.   Social History Main Topics  . Smoking status: Former Smoker    Start date: 10/24/2010  . Smokeless tobacco: Not on file  . Alcohol Use: Yes  . Drug Use: No  . Sexual Activity: Not on file   Other Topics Concern  . Not on file   Social History Narrative    FH:  Family History  Problem Relation Age of Onset  . Diabetes Father      Past Medical History  Diagnosis Date  . COPD (chronic obstructive pulmonary disease)   . Obesity   . Diabetes mellitus, type II   . Chronic systolic CHF (congestive heart failure)     Current Outpatient Prescriptions  Medication Sig Dispense Refill  . albuterol (PROVENTIL HFA;VENTOLIN HFA) 108 (90 BASE) MCG/ACT inhaler Inhale 1 puff into the lungs every 6 (six) hours as needed for wheezing or shortness of breath (sometimes allergy).    Marland Kitchen aspirin EC 81 MG EC tablet Take 1 tablet (81 mg total) by mouth daily. 30 tablet 3  . carvedilol (COREG) 3.125 MG tablet Take 3.125mg  (1 tab) in am and 6.25mg  (2 tabs) in pm 90 tablet 3  . digoxin 62.5 MCG TABS Take 0.0625 mg by mouth daily. 90 tablet 3  . docusate sodium (COLACE) 100 MG capsule Take 1 capsule (100 mg total) by mouth 2 (two) times daily. constipation 60 capsule 1  . furosemide (LASIX) 40 MG tablet Take 1 tablet (40 mg total) by mouth daily. Take an extra 40 mg for weight 199 lb or greater 60 tablet 1  . lisinopril (PRINIVIL,ZESTRIL) 5 MG tablet Take 0.5 tablets (2.5 mg total) by mouth every evening. 15 tablet 4  . metFORMIN (GLUCOPHAGE) 500 MG tablet Take 1 tablet (500 mg total) by mouth daily with breakfast. 30  tablet 3  . pantoprazole (PROTONIX) 40 MG tablet Take 1 tablet (40 mg total) by mouth daily. 30 tablet 3  . simethicone (MYLICON) 80 MG chewable tablet Chew 1 tablet (80 mg total) by mouth every 6 (six) hours as needed for flatulence. 30 tablet 0  . sitaGLIPtin (JANUVIA) 100 MG tablet Take 1 tablet (100 mg total) by mouth daily. 30 tablet 3  . spironolactone (ALDACTONE) 25 MG tablet Take 0.5 tablets (12.5 mg total) by mouth daily. 30 tablet 3   No current facility-administered medications for this encounter.    Filed Vitals:   09/22/14 0953  BP: 92/60  Pulse: 100  Weight: 207 lb 6.4 oz (94.076 kg)  SpO2: 96%    PHYSICAL EXAM:  General:  Well appearing. No resp difficulty HEENT: normal Neck: supple. JVP flat.  Carotids 2+ bilaterally; no bruits. No lymphadenopathy or thryomegaly appreciated. Cor: PMI normal. Regular rate & rhythm. No rubs, gallops or murmurs. Lungs: clear Abdomen: obese, soft, nontender, nondistended. No hepatosplenomegaly. No bruits or masses. Good bowel sounds. Extremities: no cyanosis, clubbing, rash, edema Neuro: alert & orientedx3, cranial nerves grossly intact. Moves all 4 extremities w/o difficulty. Affect pleasant.     ASSESSMENT & PLAN: 1. Chronic Systolic HF. NICM Had cath 07/18/2014 . ECHO 06/2014 EF 20-25%  NYHA II. Volume status stable. Continue lasix to 40 mg once daily with an extra 40 mg of lasix for weight 199 or greater.  Continue spiro 12.5 mg daily.   Continue  Carvedilol 3.125 mg in am and 6.25 mg in evening.  Continue digoxin to 0.0625 mg daily. ( dig level on 0.125 mg was 1.7)  Continue lisinopril 2.5 mg in the evening.    Reinforced daily weights, low salt food choices, and limiting fluid to less than 2 liters per day.  Repeat ECHO  Next visit.  Will consider CPX in the next couple of months.  2 . DM II- Hgb 9.5  Per PCP .Continue current regimen.   Follow up in 4 weeks with an ECHO. Check BMET and dig level today. --> K 3.8 Creatinine 1.14  Dig level 0.2    CLEGG,AMY NP-C. 10:21 PM

## 2014-09-22 NOTE — Patient Instructions (Signed)
Labs today  Your physician has requested that you have an echocardiogram. Echocardiography is a painless test that uses sound waves to create images of your heart. It provides your doctor with information about the size and shape of your heart and how well your heart's chambers and valves are working. This procedure takes approximately one hour. There are no restrictions for this procedure.  Your physician recommends that you schedule a follow-up appointment in: 3-4 weeks with echocardiogram

## 2014-09-27 ENCOUNTER — Other Ambulatory Visit (HOSPITAL_COMMUNITY): Payer: Self-pay | Admitting: Cardiology

## 2014-09-27 DIAGNOSIS — I5022 Chronic systolic (congestive) heart failure: Secondary | ICD-10-CM

## 2014-09-27 MED ORDER — FUROSEMIDE 40 MG PO TABS: 40.0000 mg | ORAL_TABLET | Freq: Every day | ORAL | Status: DC

## 2014-09-28 ENCOUNTER — Other Ambulatory Visit (HOSPITAL_COMMUNITY): Payer: Self-pay | Admitting: *Deleted

## 2014-10-18 ENCOUNTER — Ambulatory Visit (HOSPITAL_BASED_OUTPATIENT_CLINIC_OR_DEPARTMENT_OTHER)
Admission: RE | Admit: 2014-10-18 | Discharge: 2014-10-18 | Disposition: A | Discharge status: Home / Self Care | Payer: BLUE CROSS/BLUE SHIELD | Source: Ambulatory Visit | Attending: Internal Medicine | Admitting: Internal Medicine

## 2014-10-18 ENCOUNTER — Encounter (HOSPITAL_COMMUNITY): Payer: Self-pay

## 2014-10-18 ENCOUNTER — Ambulatory Visit (HOSPITAL_COMMUNITY)
Admission: RE | Admit: 2014-10-18 | Discharge: 2014-10-18 | Disposition: A | Discharge status: Home / Self Care | Payer: BLUE CROSS/BLUE SHIELD | Source: Ambulatory Visit | Attending: Internal Medicine | Admitting: Internal Medicine

## 2014-10-18 VITALS — BP 112/62 | HR 98 | Wt 202.0 lb

## 2014-10-18 DIAGNOSIS — I5022 Chronic systolic (congestive) heart failure: Secondary | ICD-10-CM

## 2014-10-18 DIAGNOSIS — I420 Dilated cardiomyopathy: Secondary | ICD-10-CM

## 2014-10-18 LAB — BASIC METABOLIC PANEL
Anion gap: 12 (ref 5–15)
BUN: 14 mg/dL (ref 6–20)
CALCIUM: 9.5 mg/dL (ref 8.9–10.3)
CO2: 31 mmol/L (ref 22–32)
Chloride: 92 mmol/L — ABNORMAL LOW (ref 101–111)
Creatinine, Ser: 1.02 mg/dL (ref 0.61–1.24)
GFR calc Af Amer: >60 mL/min (ref >60)
GFR calc non Af Amer: >60 mL/min (ref >60)
GLUCOSE: 143 mg/dL — AB (ref 70–99)
POTASSIUM: 3.9 mmol/L (ref 3.5–5.1)
SODIUM: 135 mmol/L (ref 135–145)

## 2014-10-18 MED ORDER — CARVEDILOL 6.25 MG PO TABS: 6.2500 mg | ORAL_TABLET | Freq: Two times a day (BID) | ORAL | Status: DC

## 2014-10-18 MED ORDER — SPIRONOLACTONE 25 MG PO TABS: 25.0000 mg | ORAL_TABLET | Freq: Every day | ORAL | Status: DC

## 2014-10-18 NOTE — Patient Instructions (Signed)
INCREASE Carvedilol (Coreg) to 6.25mg  twice daily.  INCREASE Spironolactone 25mg  once daily.  Follow 1-2 weeks for lab work only.  Follow up 1 month for routine appointment.  Do the following things EVERYDAY: 1) Weigh yourself in the morning before breakfast. Write it down and keep it in a log. 2) Take your medicines as prescribed 3) Eat low salt foods-Limit salt (sodium) to 2000 mg per day.  4) Stay as active as you can everyday 5) Limit all fluids for the day to less than 2 liters

## 2014-10-18 NOTE — Progress Notes (Signed)
  Echocardiogram 2D Echocardiogram has been performed.  Janalyn Harder 10/18/2014, 10:46 AM

## 2014-10-18 NOTE — Progress Notes (Signed)
Patient ID: TANNAR BROKER, male   DOB: 01-Nov-1950, 64 y.o.   MRN: [id number]  PCP: Dr. Kirtland Bouchard. Clelia Croft  HPI: Mr Geiman is a 64 year old with history of NICM, chronic systolic heart failure,COPD on home oxygen, DM II,  recently admitted with  chest pain and volume overload. Had an ECHO that showed EF 20-25%. Underwent cath as noted below. He was discharged on low dose bb, ace, dig, lasix, and spiro.   He returns for follow up.Overall feeling pretty good. 2 weeks ago had GI virus so he did not take lasix. Weight at home went down to 193 but now back to 198 pounds.  On our scale, his weight is down 5 lbs.  Walks 1 mile every day without dyspnea.  Able to walk up a flight of stairs. Taking all medications.  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 SBP at home 118-120.   RHC/LHC 07/18/2014 RA: 5 RV: 29/8/9 PA: 30/11 (21) PCWP: 16 Fick CO/CI: 6.0/3.0 PVR: 0.8 WU SVR: 1042 Ao sat: 94% PA sats: 73%, 74% SVC sat: 72% Large vessel. Wraps apex. Small D1. Large D2. 20-30% plaque in proximal and distal LAD and D2  ECHO 07/15/14 EF 20-25% Grade III DD ECHO 10/18/2014 EF 25-30%, diffuse hypokinesis, mildly decreased RV systolic function with normal RV size.   Labs 07/13/2014 BNP 877 Labs 07/19/2014 K 4.5 Creatinine 1.12 Labs 07/25/2014 Dig level 1.7./   Labs 08/16/2014 K 4.5 Creatinine 1.07  Labs 09/22/2014: K 3.8 creatinine 1.14 Dig level 0.2    Social History: He lives in [place] with his wife. Quit smoking many years ago. No alcohol use. No illicit drug use. Usually independent with daily activities.  Family History: Diabetes.  No CHF or CAD.  ROS: All systems negative except as listed in HPI, PMH and Problem List.  SH:  History   Social History  . Marital Status: Married    Spouse Name: N/A  . Number of Children: N/A  . Years of Education: N/A   Occupational History  . Not on file.   Social History Main Topics  . Smoking status: Former Smoker    Start date: 10/24/2010  . Smokeless  tobacco: Not on file  . Alcohol Use: Yes  . Drug Use: No  . Sexual Activity: Not on file   Other Topics Concern  . Not on file   Social History Narrative    Past Medical History  Diagnosis Date  . COPD (chronic obstructive pulmonary disease)   . Obesity   . Diabetes mellitus, type II   . Chronic systolic CHF (congestive heart failure)     Current Outpatient Prescriptions  Medication Sig Dispense Refill  . albuterol (PROVENTIL HFA;VENTOLIN HFA) 108 (90 BASE) MCG/ACT inhaler Inhale 1 puff into the lungs every 6 (six) hours as needed for wheezing or shortness of breath (sometimes allergy).    Marland Kitchen aspirin EC 81 MG EC tablet Take 1 tablet (81 mg total) by mouth daily. 30 tablet 3  . carvedilol (COREG) 3.125 MG tablet Take 3.125mg  (1 tab) in am and 6.25mg  (2 tabs) in pm 90 tablet 3  . digoxin 62.5 MCG TABS Take 0.0625 mg by mouth daily. 90 tablet 3  . docusate sodium (COLACE) 100 MG capsule Take 1 capsule (100 mg total) by mouth 2 (two) times daily. constipation 60 capsule 1  . furosemide (LASIX) 40 MG tablet Take 1 tablet (40 mg total) by mouth daily. Take an extra 40 mg for weight 199 lb  or greater 60 tablet 2  . lisinopril (PRINIVIL,ZESTRIL) 5 MG tablet Take 0.5 tablets (2.5 mg total) by mouth every evening. 15 tablet 4  . metFORMIN (GLUCOPHAGE) 500 MG tablet Take 1 tablet (500 mg total) by mouth daily with breakfast. 30 tablet 3  . pantoprazole (PROTONIX) 40 MG tablet Take 1 tablet (40 mg total) by mouth daily. 30 tablet 3  . simethicone (MYLICON) 80 MG chewable tablet Chew 1 tablet (80 mg total) by mouth every 6 (six) hours as needed for flatulence. 30 tablet 0  . sitaGLIPtin (JANUVIA) 100 MG tablet Take 1 tablet (100 mg total) by mouth daily. 30 tablet 3  . spironolactone (ALDACTONE) 25 MG tablet Take 0.5 tablets (12.5 mg total) by mouth daily. 30 tablet 3   No current facility-administered medications for this encounter.    Filed Vitals:   10/18/14 1045  BP: 112/62  Pulse: 98   Weight: 202 lb (91.627 kg)  SpO2: 96%    PHYSICAL EXAM: General:  Well appearing. No resp difficulty HEENT: normal Neck: supple. JVP flat. Carotids 2+ bilaterally; no bruits. No lymphadenopathy or thryomegaly appreciated. Cor: PMI normal. Regular rate & rhythm. No rubs, gallops or murmurs. Lungs: clear Abdomen: obese, soft, nontender, nondistended. No hepatosplenomegaly. No bruits or masses. Good bowel sounds. Extremities: no cyanosis, clubbing, rash, edema Neuro: alert & orientedx3, cranial nerves grossly intact. Moves all 4 extremities w/o difficulty. Affect pleasant.  ASSESSMENT & PLAN: 1. Chronic Systolic HF:  Nonischemic cardiomyopathy, possible viral myocarditis.  Had cath 07/18/2014 without significant coronary disease. ECHO 06/2014 EF 20-25% => ECHO 5/16 EF 25-30%. NYHA class II, does not appear volume overloaded.   - Discussed ICD.  He wants to try medical management for a bit longer before committing to this.  Will get cardiac MRI to reassess EF and to look for evidence of myocarditis/infiltrative disease in 8/16.   - Continue lasix to 40 mg once daily with an extra 40 mg of lasix for weight 199 or greater.  - Increase spiro 25 mg daily.  - Increase Coreg to 6.25 mg bid.  - Continue digoxin 0.0625 mg daily, last dig level 0.2.   - Continue lisinopril 2.5 mg in the evening.    - Reinforced daily weights, low salt food choices, and limiting fluid to less than 2 liters per day.  - Will consider CPX in the next couple of months.  2 . DM II: Hgb 9.5  Per PCP.  Continue current regimen.   Follow up in 4 weeks.    CLEGG,AMY NP-C. 10:47 AM   Patient seen with NP, agree with the above note.  EF remains depressed, in the 25-30% range.  Nonischemic cardiomyopathy, ?myocarditis.  We discussed ICD today (not CRT candidate with narrow QRS).  He wants to hold off for now, wants few more months medical treatment.  I will have him get a cardiac MRI for EF reassessment and to look for cause of  cardiomyopathy in 8/16.   Increase Coreg and spironolactone as above.  Followup 1 month for medication titration.   Marca Ancona 10/18/2014

## 2014-10-26 ENCOUNTER — Ambulatory Visit (HOSPITAL_COMMUNITY)
Admission: RE | Admit: 2014-10-26 | Discharge: 2014-10-26 | Disposition: A | Discharge status: Home / Self Care | Payer: BLUE CROSS/BLUE SHIELD | Source: Ambulatory Visit | Attending: Cardiology | Admitting: Cardiology

## 2014-10-26 DIAGNOSIS — I5022 Chronic systolic (congestive) heart failure: Secondary | ICD-10-CM | POA: Diagnosis not present

## 2014-10-26 LAB — BASIC METABOLIC PANEL
Anion gap: 12 (ref 5–15)
BUN: 20 mg/dL (ref 6–20)
CALCIUM: 9.7 mg/dL (ref 8.9–10.3)
CHLORIDE: 95 mmol/L — AB (ref 101–111)
CO2: 27 mmol/L (ref 22–32)
Creatinine, Ser: 1.09 mg/dL (ref 0.61–1.24)
GFR calc Af Amer: >60 mL/min (ref >60)
GFR calc non Af Amer: >60 mL/min (ref >60)
Glucose, Bld: 164 mg/dL — ABNORMAL HIGH (ref 70–99)
Potassium: 4.2 mmol/L (ref 3.5–5.1)
Sodium: 134 mmol/L — ABNORMAL LOW (ref 135–145)

## 2014-11-07 ENCOUNTER — Ambulatory Visit (HOSPITAL_COMMUNITY): Payer: BLUE CROSS/BLUE SHIELD | Attending: Cardiology

## 2014-11-07 ENCOUNTER — Other Ambulatory Visit (HOSPITAL_COMMUNITY): Payer: Self-pay

## 2014-11-07 DIAGNOSIS — I5022 Chronic systolic (congestive) heart failure: Secondary | ICD-10-CM | POA: Insufficient documentation

## 2014-11-07 MED ORDER — FUROSEMIDE 40 MG PO TABS: 40.0000 mg | ORAL_TABLET | Freq: Every day | ORAL | Status: DC

## 2014-11-08 ENCOUNTER — Other Ambulatory Visit (HOSPITAL_COMMUNITY): Payer: Self-pay | Admitting: *Deleted

## 2014-11-08 DIAGNOSIS — I5022 Chronic systolic (congestive) heart failure: Secondary | ICD-10-CM | POA: Diagnosis not present

## 2014-11-15 ENCOUNTER — Encounter: Payer: Self-pay | Admitting: Cardiology

## 2014-11-15 ENCOUNTER — Other Ambulatory Visit (HOSPITAL_COMMUNITY): Payer: Self-pay | Admitting: *Deleted

## 2014-11-15 MED ORDER — ASPIRIN 81 MG PO TBEC: 81.0000 mg | DELAYED_RELEASE_TABLET | Freq: Every day | ORAL | Status: DC

## 2014-11-15 MED ORDER — SITAGLIPTIN PHOSPHATE 100 MG PO TABS: 100.0000 mg | ORAL_TABLET | Freq: Every day | ORAL | Status: DC

## 2014-11-15 MED ORDER — PANTOPRAZOLE SODIUM 40 MG PO TBEC: 40.0000 mg | DELAYED_RELEASE_TABLET | Freq: Every day | ORAL | Status: DC

## 2014-11-16 ENCOUNTER — Other Ambulatory Visit (HOSPITAL_COMMUNITY): Payer: Self-pay | Admitting: *Deleted

## 2014-11-16 MED ORDER — ASPIRIN 81 MG PO TBEC: 81.0000 mg | DELAYED_RELEASE_TABLET | Freq: Every day | ORAL | Status: DC

## 2014-11-16 MED ORDER — PANTOPRAZOLE SODIUM 40 MG PO TBEC: 40.0000 mg | DELAYED_RELEASE_TABLET | Freq: Every day | ORAL | Status: DC

## 2014-11-18 ENCOUNTER — Encounter (HOSPITAL_COMMUNITY): Payer: Self-pay

## 2014-11-18 ENCOUNTER — Ambulatory Visit (HOSPITAL_COMMUNITY)
Admission: RE | Admit: 2014-11-18 | Discharge: 2014-11-18 | Disposition: A | Discharge status: Home / Self Care | Payer: BLUE CROSS/BLUE SHIELD | Source: Ambulatory Visit | Attending: Cardiology | Admitting: Cardiology

## 2014-11-18 VITALS — BP 92/68 | HR 95 | Wt 208.0 lb

## 2014-11-18 DIAGNOSIS — Z87891 Personal history of nicotine dependence: Secondary | ICD-10-CM | POA: Insufficient documentation

## 2014-11-18 DIAGNOSIS — Z7982 Long term (current) use of aspirin: Secondary | ICD-10-CM | POA: Insufficient documentation

## 2014-11-18 DIAGNOSIS — Z833 Family history of diabetes mellitus: Secondary | ICD-10-CM | POA: Diagnosis not present

## 2014-11-18 DIAGNOSIS — E1310 Other specified diabetes mellitus with ketoacidosis without coma: Secondary | ICD-10-CM | POA: Diagnosis not present

## 2014-11-18 DIAGNOSIS — Z79899 Other long term (current) drug therapy: Secondary | ICD-10-CM | POA: Diagnosis not present

## 2014-11-18 DIAGNOSIS — E119 Type 2 diabetes mellitus without complications: Secondary | ICD-10-CM | POA: Insufficient documentation

## 2014-11-18 DIAGNOSIS — Z9981 Dependence on supplemental oxygen: Secondary | ICD-10-CM | POA: Insufficient documentation

## 2014-11-18 DIAGNOSIS — E669 Obesity, unspecified: Secondary | ICD-10-CM | POA: Diagnosis not present

## 2014-11-18 DIAGNOSIS — I429 Cardiomyopathy, unspecified: Secondary | ICD-10-CM | POA: Diagnosis not present

## 2014-11-18 DIAGNOSIS — E1110 Type 2 diabetes mellitus with ketoacidosis without coma: Secondary | ICD-10-CM

## 2014-11-18 DIAGNOSIS — J449 Chronic obstructive pulmonary disease, unspecified: Secondary | ICD-10-CM | POA: Insufficient documentation

## 2014-11-18 DIAGNOSIS — I5022 Chronic systolic (congestive) heart failure: Secondary | ICD-10-CM | POA: Diagnosis not present

## 2014-11-18 MED ORDER — LISINOPRIL 5 MG PO TABS: 5.0000 mg | ORAL_TABLET | Freq: Every evening | ORAL | Status: DC

## 2014-11-18 NOTE — Progress Notes (Signed)
Patient ID: [name], male   DOB: 06-24-1950, 64 y.o.   MRN: [id number] PCP: Dr. Kirtland Bouchard. Clelia Croft  HPI: [name] is a 64 year old with history of NICM, chronic systolic heart failure,COPD on home oxygen, DM II,  recently admitted with  chest pain and volume overload. Had an ECHO that showed EF 20-25%. Underwent cath as noted below. He was discharged on low dose bb, ace, dig, lasix, and spiro.   He returns for follow up.last visit carvedilol and spiro increased. Overall feeling pretty good. Denies SOB/PND/Orthopnea/CP. Weight at home 199-200 pounds. Busy in his garden. Taking all medications. SBP at home 110-128.    CPX 11/08/2014 Peak VO2: 17.8 (70.8% predicted peak VO2)  VE/VCO2 slope: 25.3  OUES: 2.27 Peak RER:  1.00   RHC/LHC 07/18/2014 RA: 5 RV: 29/8/9 PA: 30/11 (21) PCWP: 16 Fick CO/CI: 6.0/3.0 PVR: 0.8 WU SVR: 1042 Ao sat: 94% PA sats: 73%, 74% SVC sat: 72% Large vessel. Wraps apex. Small D1. Large D2. 20-30% plaque in proximal and distal LAD and D2  ECHO 07/15/14 EF 20-25% Grade III DD ECHO 10/18/2014 EF 25-30%, diffuse hypokinesis, mildly decreased RV systolic function with normal RV size.   Labs 07/13/2014 BNP 877 Labs 07/19/2014 K 4.5 Creatinine 1.12 Labs 07/25/2014 Dig level 1.7./   Labs 08/16/2014 K 4.5 Creatinine 1.07  Labs 09/22/2014: K 3.8 creatinine 1.14 Dig level 0.2  Labs 10/26/2014: K 4.2 Creatinine 1.09    Social History: He lives in [place] with his wife. Quit smoking many years ago. No alcohol use. No illicit drug use. Usually independent with daily activities.  Family History: Diabetes.  No CHF or CAD.  ROS: All systems negative except as listed in HPI, PMH and Problem List.  SH:  History   Social History  . Marital Status: Married    Spouse Name: N/A  . Number of Children: N/A  . Years of Education: N/A   Occupational History  . Not on file.   Social History Main Topics  . Smoking status: Former Smoker    Start date: 10/24/2010  . Smokeless tobacco:  Not on file  . Alcohol Use: Yes  . Drug Use: No  . Sexual Activity: Not on file   Other Topics Concern  . Not on file   Social History Narrative    Past Medical History  Diagnosis Date  . COPD (chronic obstructive pulmonary disease)   . Obesity   . Diabetes mellitus, type II   . Chronic systolic CHF (congestive heart failure)     Current Outpatient Prescriptions  Medication Sig Dispense Refill  . albuterol (PROVENTIL HFA;VENTOLIN HFA) 108 (90 BASE) MCG/ACT inhaler Inhale 1 puff into the lungs every 6 (six) hours as needed for wheezing or shortness of breath (sometimes allergy).    Marland Kitchen aspirin 81 MG EC tablet Take 1 tablet (81 mg total) by mouth daily. 30 tablet 3  . carvedilol (COREG) 6.25 MG tablet Take 1 tablet (6.25 mg total) by mouth 2 (two) times daily with a meal. 60 tablet 6  . digoxin 62.5 MCG TABS Take 0.0625 mg by mouth daily. 90 tablet 3  . docusate sodium (COLACE) 100 MG capsule Take 1 capsule (100 mg total) by mouth 2 (two) times daily. constipation 60 capsule 1  . furosemide (LASIX) 40 MG tablet Take 1 tablet (40 mg total) by mouth daily. Take an extra 40 mg for weight 199 lb or greater 60 tablet 6  . lisinopril (PRINIVIL,ZESTRIL) 5 MG tablet Take 0.5 tablets (  2.5 mg total) by mouth every evening. 15 tablet 4  . metFORMIN (GLUCOPHAGE) 500 MG tablet Take 1 tablet (500 mg total) by mouth daily with breakfast. (Patient taking differently: Take 500 mg by mouth 2 (two) times daily with a meal. ) 30 tablet 3  . pantoprazole (PROTONIX) 40 MG tablet Take 1 tablet (40 mg total) by mouth daily. 30 tablet 3  . simethicone (MYLICON) 80 MG chewable tablet Chew 1 tablet (80 mg total) by mouth every 6 (six) hours as needed for flatulence. 30 tablet 0  . sitaGLIPtin (JANUVIA) 100 MG tablet Take 1 tablet (100 mg total) by mouth daily. 30 tablet 3  . spironolactone (ALDACTONE) 25 MG tablet Take 1 tablet (25 mg total) by mouth daily. 30 tablet 6   No current facility-administered  medications for this encounter.    Filed Vitals:   11/18/14 0852  BP: 92/68  Pulse: 95  Weight: 208 lb (94.348 kg)  SpO2: 100%    PHYSICAL EXAM: General:  Well appearing. No resp difficulty. Ambulated in the clinic without difficulty.  HEENT: normal Neck: supple. JVP flat. Carotids 2+ bilaterally; no bruits. No lymphadenopathy or thryomegaly appreciated. Cor: PMI normal. Regular rate & rhythm. No rubs, gallops or murmurs. Lungs: clear Abdomen: obese, soft, nontender, nondistended. No hepatosplenomegaly. No bruits or masses. Good bowel sounds. Extremities: no cyanosis, clubbing, rash, edema Neuro: alert & orientedx3, cranial nerves grossly intact. Moves all 4 extremities w/o difficulty. Affect pleasant.  ASSESSMENT & PLAN: 1. Chronic Systolic HF:  Nonischemic cardiomyopathy, possible viral myocarditis.  Had cath 07/18/2014 without significant coronary disease. ECHO 06/2014 EF 20-25% => ECHO 5/16 EF 25-30%.  NYHA class II, does not appear volume overloaded.   - Discussed ICD.  He wants to try medical management for a bit longer before committing to this.  Will get cardiac MRI to reassess EF and to look for evidence of myocarditis/infiltrative disease in 8/16.   - Continue lasix to 40 mg once daily with an extra 40 mg of lasix for weight 199 or greater.  - Continue spiro 25 mg daily.   - Continue Coreg to 6.25 mg bid.  - Continue digoxin 0.0625 mg daily, last dig level 0.2.   - Increase lisinopril 5 mg in the evening.   Repeat BMET in 7 days.  - Reinforced daily weights, low salt food choices, and limiting fluid to less than 2 liters per day.  - CPX -->Ok mild function limitation from Lungs not heart.  2 . DM II: Hgb 9.5  Per PCP.  Continue current regimen.   Follow up in 4 weeks.    Verbon Giangregorio NP-C. 9:13 AM

## 2014-11-18 NOTE — Patient Instructions (Signed)
INCREASE Lisinopril to 5 mg, one tab at bedtime  Labs needed in 1 week  Your physician recommends that you schedule a follow-up appointment in: 3 weeks  Do the following things EVERYDAY: 1) Weigh yourself in the morning before breakfast. Write it down and keep it in a log. 2) Take your medicines as prescribed 3) Eat low salt foods-Limit salt (sodium) to 2000 mg per day.  4) Stay as active as you can everyday 5) Limit all fluids for the day to less than 2 liters 6)

## 2014-11-21 ENCOUNTER — Other Ambulatory Visit (HOSPITAL_COMMUNITY): Payer: BLUE CROSS/BLUE SHIELD

## 2014-11-22 ENCOUNTER — Ambulatory Visit (HOSPITAL_COMMUNITY): Admission: RE | Admit: 2014-11-22 | Payer: BLUE CROSS/BLUE SHIELD | Source: Ambulatory Visit

## 2014-11-23 ENCOUNTER — Encounter (HOSPITAL_COMMUNITY): Payer: Self-pay | Admitting: *Deleted

## 2014-11-25 ENCOUNTER — Encounter (HOSPITAL_COMMUNITY): Payer: Self-pay

## 2014-11-25 ENCOUNTER — Ambulatory Visit (HOSPITAL_COMMUNITY)
Admission: RE | Admit: 2014-11-25 | Discharge: 2014-11-25 | Disposition: A | Discharge status: Home / Self Care | Payer: BLUE CROSS/BLUE SHIELD | Source: Ambulatory Visit | Attending: Internal Medicine | Admitting: Internal Medicine

## 2014-11-25 VITALS — BP 94/64 | HR 98 | Wt 210.5 lb

## 2014-11-25 DIAGNOSIS — E119 Type 2 diabetes mellitus without complications: Secondary | ICD-10-CM | POA: Diagnosis not present

## 2014-11-25 DIAGNOSIS — Z79899 Other long term (current) drug therapy: Secondary | ICD-10-CM | POA: Insufficient documentation

## 2014-11-25 DIAGNOSIS — I5022 Chronic systolic (congestive) heart failure: Secondary | ICD-10-CM | POA: Diagnosis not present

## 2014-11-25 DIAGNOSIS — J449 Chronic obstructive pulmonary disease, unspecified: Secondary | ICD-10-CM | POA: Insufficient documentation

## 2014-11-25 DIAGNOSIS — Z87891 Personal history of nicotine dependence: Secondary | ICD-10-CM | POA: Diagnosis not present

## 2014-11-25 DIAGNOSIS — E669 Obesity, unspecified: Secondary | ICD-10-CM | POA: Insufficient documentation

## 2014-11-25 DIAGNOSIS — Z7982 Long term (current) use of aspirin: Secondary | ICD-10-CM | POA: Insufficient documentation

## 2014-11-25 DIAGNOSIS — Z833 Family history of diabetes mellitus: Secondary | ICD-10-CM | POA: Insufficient documentation

## 2014-11-25 DIAGNOSIS — Z9981 Dependence on supplemental oxygen: Secondary | ICD-10-CM | POA: Diagnosis not present

## 2014-11-25 LAB — BASIC METABOLIC PANEL
Anion gap: 11 (ref 5–15)
BUN: 24 mg/dL — AB (ref 6–20)
CO2: 28 mmol/L (ref 22–32)
CREATININE: 1.26 mg/dL — AB (ref 0.61–1.24)
Calcium: 9.6 mg/dL (ref 8.9–10.3)
Chloride: 93 mmol/L — ABNORMAL LOW (ref 101–111)
GFR calc Af Amer: >60 mL/min (ref >60)
GFR, EST NON AFRICAN AMERICAN: 59 mL/min — AB (ref >60)
Glucose, Bld: 158 mg/dL — ABNORMAL HIGH (ref 65–99)
POTASSIUM: 4.6 mmol/L (ref 3.5–5.1)
Sodium: 132 mmol/L — ABNORMAL LOW (ref 135–145)

## 2014-11-25 MED ORDER — FUROSEMIDE 40 MG PO TABS: 40.0000 mg | ORAL_TABLET | Freq: Every day | ORAL | Status: DC

## 2014-11-25 MED ORDER — LISINOPRIL 5 MG PO TABS: 2.5000 mg | ORAL_TABLET | Freq: Every evening | ORAL | Status: DC

## 2014-11-25 NOTE — Patient Instructions (Signed)
DECREASE Lisinopril to 2.5 mg at bedtime (1/2 tablet)  HOLD Lasix tomorrow only take extra Lasix if weight is 205 or greater.  KEEP follow up appt as scheduled. Monday June 27th 2016 at 10:40 a.m  GATE CODE: [id number]

## 2014-11-25 NOTE — Addendum Note (Signed)
Encounter addended by: Modesta Messing, CMA on: 11/25/2014 10:47 AM<BR>     Documentation filed: Orders, Dx Association, Patient Instructions Section

## 2014-11-25 NOTE — Addendum Note (Signed)
Encounter addended by: Modesta Messing, CMA on: 11/25/2014  9:46 AM<BR>     Documentation filed: Medications, Vitals Section, Dx Association, Orders

## 2014-11-25 NOTE — Progress Notes (Signed)
Patient ID: [name], male   DOB: 07/08/1950, 64 y.o.   MRN: [id number] Patient ID: [name], male   DOB: 05/20/1951, 64 y.o.   MRN: [id number] PCP: Dr. Kirtland Bouchard. Clelia Croft  HPI: [name] is a 64 year old with history of NICM, chronic systolic heart failure,COPD on home oxygen, DM II,  recently admitted with  chest pain and volume overload. Had an ECHO that showed EF 20-25%. Underwent cath as noted below. He was discharged on low dose bb, ace, dig, lasix, and spiro.   He returns for follow up. Last visit lisinopril was increased to 5 mg at bed time. Says he has been taking extra 40 mg of lasix for the last week.  Having increased dizziness with increased lisinopril. Overall feeling fair today.  Denies SOB/PND/Orthopnea/CP. Weight at home 198-202pounds. Says his appetite has increased. Busy in his garden. Taking all medications. SBP at home 90-100.     CPX 11/08/2014 Peak VO2: 17.8 (70.8% predicted peak VO2)  VE/VCO2 slope: 25.3  OUES: 2.27 Peak RER:  1.00   RHC/LHC 07/18/2014 RA: 5 RV: 29/8/9 PA: 30/11 (21) PCWP: 16 Fick CO/CI: 6.0/3.0 PVR: 0.8 WU SVR: 1042 Ao sat: 94% PA sats: 73%, 74% SVC sat: 72% Large vessel. Wraps apex. Small D1. Large D2. 20-30% plaque in proximal and distal LAD and D2  ECHO 07/15/14 EF 20-25% Grade III DD ECHO 10/18/2014 EF 25-30%, diffuse hypokinesis, mildly decreased RV systolic function with normal RV size.   Labs 07/13/2014 BNP 877 Labs 07/19/2014 K 4.5 Creatinine 1.12 Labs 07/25/2014 Dig level 1.7./   Labs 08/16/2014 K 4.5 Creatinine 1.07  Labs 09/22/2014: K 3.8 creatinine 1.14 Dig level 0.2  Labs 10/26/2014: K 4.2 Creatinine 1.09    Social History: He lives in [place] with his wife. Quit smoking many years ago. No alcohol use. No illicit drug use. Usually independent with daily activities.  Family History: Diabetes.  No CHF or CAD.  ROS: All systems negative except as listed in HPI, PMH and Problem List.  SH:  History   Social History  . Marital Status:  Married    Spouse Name: N/A  . Number of Children: N/A  . Years of Education: N/A   Occupational History  . Not on file.   Social History Main Topics  . Smoking status: Former Smoker    Start date: 10/24/2010  . Smokeless tobacco: Not on file  . Alcohol Use: Yes  . Drug Use: No  . Sexual Activity: Not on file   Other Topics Concern  . Not on file   Social History Narrative    Past Medical History  Diagnosis Date  . COPD (chronic obstructive pulmonary disease)   . Obesity   . Diabetes mellitus, type II   . Chronic systolic CHF (congestive heart failure)     Current Outpatient Prescriptions  Medication Sig Dispense Refill  . albuterol (PROVENTIL HFA;VENTOLIN HFA) 108 (90 BASE) MCG/ACT inhaler Inhale 1 puff into the lungs every 6 (six) hours as needed for wheezing or shortness of breath (sometimes allergy).    Marland Kitchen aspirin 81 MG EC tablet Take 1 tablet (81 mg total) by mouth daily. 30 tablet 3  . carvedilol (COREG) 6.25 MG tablet Take 1 tablet (6.25 mg total) by mouth 2 (two) times daily with a meal. 60 tablet 6  . digoxin 62.5 MCG TABS Take 0.0625 mg by mouth daily. 90 tablet 3  . docusate sodium (COLACE) 100 MG capsule Take 1 capsule (100 mg total) by  mouth 2 (two) times daily. constipation 60 capsule 1  . furosemide (LASIX) 40 MG tablet Take 1 tablet (40 mg total) by mouth daily. Take an extra 40 mg for weight 199 lb or greater 60 tablet 6  . lisinopril (PRINIVIL,ZESTRIL) 5 MG tablet Take 1 tablet (5 mg total) by mouth every evening. 30 tablet 4  . metFORMIN (GLUCOPHAGE) 500 MG tablet Take 1 tablet (500 mg total) by mouth daily with breakfast. (Patient taking differently: Take 500 mg by mouth 2 (two) times daily with a meal. ) 30 tablet 3  . pantoprazole (PROTONIX) 40 MG tablet Take 1 tablet (40 mg total) by mouth daily. 30 tablet 3  . simethicone (MYLICON) 80 MG chewable tablet Chew 1 tablet (80 mg total) by mouth every 6 (six) hours as needed for flatulence. 30 tablet 0  .  sitaGLIPtin (JANUVIA) 100 MG tablet Take 1 tablet (100 mg total) by mouth daily. 30 tablet 3  . spironolactone (ALDACTONE) 25 MG tablet Take 1 tablet (25 mg total) by mouth daily. 30 tablet 6   No current facility-administered medications for this encounter.    Filed Vitals:   11/25/14 0942  BP: 94/64  Pulse: 98  Weight: 210 lb 8 oz (95.482 kg)  SpO2: 96%    PHYSICAL EXAM: General:  Well appearing. No resp difficulty. Ambulated in the clinic without difficulty.  HEENT: normal Neck: supple. JVP flat. Carotids 2+ bilaterally; no bruits. No lymphadenopathy or thryomegaly appreciated. Cor: PMI normal. Regular rate & rhythm. No rubs, gallops or murmurs. Lungs: clear Abdomen: obese, soft, nontender, nondistended. No hepatosplenomegaly. No bruits or masses. Good bowel sounds. Extremities: no cyanosis, clubbing, rash, edema Neuro: alert & orientedx3, cranial nerves grossly intact. Moves all 4 extremities w/o difficulty. Affect pleasant.  ASSESSMENT & PLAN: 1. Chronic Systolic HF:  Nonischemic cardiomyopathy, possible viral myocarditis.  Had cath 07/18/2014 without significant coronary disease. ECHO 06/2014 EF 20-25% => ECHO 5/16 EF 25-30%.  NYHA class II, does not appear volume overloaded.   - Discussed ICD.  He wants to try medical management for a bit longer before committing to this.  Will get cardiac MRI to reassess EF and to look for evidence of myocarditis/infiltrative disease in 8/16.   - Continue lasix to 40 mg once daily with an extra 40 mg of lasix for weight  205 or greater.  - Continue spiro 25 mg daily.   - Continue Coreg to 6.25 mg bid.  - Continue digoxin 0.0625 mg daily, last dig level 0.2.   -Cut back lisinopril to 2.5 mg in the evening.    - Reinforced daily weights, low salt food choices, and limiting fluid to less than 2 liters per day.  - CPX -->Ok mild function limitation from Lungs not heart.  2 . DM II: Hgb 9.5  Per PCP.  Continue current regimen.   Follow up in 4  weeks.    Dejanay Wamboldt NP-C. 10:31 AM

## 2014-11-25 NOTE — Addendum Note (Signed)
Encounter addended by: Sherald Hess, NP on: 11/25/2014 10:40 AM<BR>     Documentation filed: Notes Section

## 2014-12-01 ENCOUNTER — Encounter: Payer: Self-pay | Admitting: Cardiology

## 2014-12-12 ENCOUNTER — Ambulatory Visit (HOSPITAL_COMMUNITY)
Admission: RE | Admit: 2014-12-12 | Discharge: 2014-12-12 | Disposition: A | Discharge status: Home / Self Care | Payer: BLUE CROSS/BLUE SHIELD | Source: Ambulatory Visit | Attending: Cardiology | Admitting: Cardiology

## 2014-12-12 VITALS — BP 96/72 | HR 105 | Wt 213.0 lb

## 2014-12-12 DIAGNOSIS — E119 Type 2 diabetes mellitus without complications: Secondary | ICD-10-CM | POA: Diagnosis not present

## 2014-12-12 DIAGNOSIS — Z7982 Long term (current) use of aspirin: Secondary | ICD-10-CM | POA: Insufficient documentation

## 2014-12-12 DIAGNOSIS — I429 Cardiomyopathy, unspecified: Secondary | ICD-10-CM | POA: Insufficient documentation

## 2014-12-12 DIAGNOSIS — I5022 Chronic systolic (congestive) heart failure: Secondary | ICD-10-CM | POA: Insufficient documentation

## 2014-12-12 DIAGNOSIS — Z9981 Dependence on supplemental oxygen: Secondary | ICD-10-CM | POA: Diagnosis not present

## 2014-12-12 DIAGNOSIS — Z87891 Personal history of nicotine dependence: Secondary | ICD-10-CM | POA: Insufficient documentation

## 2014-12-12 DIAGNOSIS — Z79899 Other long term (current) drug therapy: Secondary | ICD-10-CM | POA: Diagnosis not present

## 2014-12-12 DIAGNOSIS — J449 Chronic obstructive pulmonary disease, unspecified: Secondary | ICD-10-CM | POA: Diagnosis not present

## 2014-12-12 LAB — BASIC METABOLIC PANEL
Anion gap: 12 (ref 5–15)
BUN: 25 mg/dL — AB (ref 6–20)
CALCIUM: 9.5 mg/dL (ref 8.9–10.3)
CO2: 27 mmol/L (ref 22–32)
CREATININE: 1.09 mg/dL (ref 0.61–1.24)
Chloride: 94 mmol/L — ABNORMAL LOW (ref 101–111)
GFR calc Af Amer: >60 mL/min (ref >60)
GFR calc non Af Amer: >60 mL/min (ref >60)
Glucose, Bld: 160 mg/dL — ABNORMAL HIGH (ref 65–99)
Potassium: 4.3 mmol/L (ref 3.5–5.1)
Sodium: 133 mmol/L — ABNORMAL LOW (ref 135–145)

## 2014-12-12 LAB — BRAIN NATRIURETIC PEPTIDE: B NATRIURETIC PEPTIDE 5: 13.1 pg/mL (ref 0.0–100.0)

## 2014-12-12 NOTE — Progress Notes (Signed)
Patient ID: [name], male   DOB: [birth year], 64 y.o.   MRN: [id number] PCP: Dr. Kirtland Bouchard. [name]  HPI: [name] is a 64 year old with history of NICM, chronic systolic heart failure,COPD on home oxygen, and DM II was admitted in 1/16 with chest pain and volume overload. Had an ECHO that showed EF 20-25%. Underwent cath as noted below. He was discharged on low dose bb, ace, dig, lasix, and spiro.   He returns for follow up.  At last visit, lisinopril was cut back to 2.5 mg qhs because of lightheadedness. This has resolved.  SBP runs 90s-100s at home.  HR 90s typically, in 100s today.  He is in NSR.  Weight is up 3 lbs.  He is feeling "pretty good" generally.  No dyspnea walking at a steady pace on flat ground and ok walking up a flight of steps.  He is short of breath if he walks fast.  No orthopnea or PND. Last echo in 5/16 showed that EF remained 25-30%.    CPX 11/08/2014 Peak VO2: 17.8 (70.8% predicted peak VO2)  VE/VCO2 slope: 25.3  OUES: 2.27 Peak RER:  1.00  RHC/LHC 07/18/2014 RA: 5 RV: 29/8/9 PA: 30/11 (21) PCWP: 16 Fick CO/CI: 6.0/3.0 PVR: 0.8 WU SVR: 1042 Ao sat: 94% PA sats: 73%, 74% SVC sat: 72% Large vessel. Wraps apex. Small D1. Large D2. 20-30% plaque in proximal and distal LAD and D2  ECHO 07/15/14 EF 20-25% Grade III DD ECHO 10/18/2014 EF 25-30%, diffuse hypokinesis, mildly decreased RV systolic function with normal RV size.   Labs 07/13/2014 BNP 877 Labs 07/19/2014 K 4.5 Creatinine 1.12 Labs 07/25/2014 Dig level 1.7./   Labs 08/16/2014 K 4.5 Creatinine 1.07  Labs 09/22/2014: K 3.8 creatinine 1.14 Dig level 0.2  Labs 10/26/2014: K 4.2 Creatinine 1.09   Labs 6/16: K 4.6, creatinine 1.26  ECG: NSR at 98, narrow QRS, poor RWP  Social History: He lives in [place] with his wife. Quit smoking many years ago. No alcohol use. No illicit drug use. Usually independent with daily activities.  Family History: Diabetes.  No CHF or CAD.  ROS: All systems negative except as listed in HPI,  PMH and Problem List.  SH:  History   Social History  . Marital Status: Married    Spouse Name: N/A  . Number of Children: N/A  . Years of Education: N/A   Occupational History  . Not on file.   Social History Main Topics  . Smoking status: Former Smoker    Start date: 10/24/2010  . Smokeless tobacco: Not on file  . Alcohol Use: Yes  . Drug Use: No  . Sexual Activity: Not on file   Other Topics Concern  . Not on file   Social History Narrative    Past Medical History  Diagnosis Date  . COPD (chronic obstructive pulmonary disease)   . Obesity   . Diabetes mellitus, type II   . Chronic systolic CHF (congestive heart failure)     Current Outpatient Prescriptions  Medication Sig Dispense Refill  . aspirin 81 MG EC tablet Take 1 tablet (81 mg total) by mouth daily. 30 tablet 3  . carvedilol (COREG) 6.25 MG tablet Take 1 tablet (6.25 mg total) by mouth 2 (two) times daily with a meal. 60 tablet 6  . digoxin 62.5 MCG TABS Take 0.0625 mg by mouth daily. 90 tablet 3  . docusate sodium (COLACE) 100 MG capsule Take 1 capsule (100 mg total) by mouth 2 (  two) times daily. constipation 60 capsule 1  . furosemide (LASIX) 40 MG tablet Take 1 tablet (40 mg total) by mouth daily. Take an extra 40 mg for weight 205 lbs or greater 60 tablet 6  . lisinopril (PRINIVIL,ZESTRIL) 5 MG tablet Take 0.5 tablets (2.5 mg total) by mouth every evening. 30 tablet 4  . metFORMIN (GLUCOPHAGE) 500 MG tablet Take 1 tablet (500 mg total) by mouth daily with breakfast. (Patient taking differently: Take 500 mg by mouth 2 (two) times daily with a meal. ) 30 tablet 3  . pantoprazole (PROTONIX) 40 MG tablet Take 1 tablet (40 mg total) by mouth daily. 30 tablet 3  . simethicone (MYLICON) 80 MG chewable tablet Chew 1 tablet (80 mg total) by mouth every 6 (six) hours as needed for flatulence. 30 tablet 0  . sitaGLIPtin (JANUVIA) 100 MG tablet Take 1 tablet (100 mg total) by mouth daily. 30 tablet 3  .  spironolactone (ALDACTONE) 25 MG tablet Take 1 tablet (25 mg total) by mouth daily. 30 tablet 6  . albuterol (PROVENTIL HFA;VENTOLIN HFA) 108 (90 BASE) MCG/ACT inhaler Inhale 1 puff into the lungs every 6 (six) hours as needed for wheezing or shortness of breath (sometimes allergy).     No current facility-administered medications for this encounter.    Filed Vitals:   12/12/14 1058  BP: 96/72  Pulse: 105  Weight: 213 lb (96.616 kg)  SpO2: 94%    PHYSICAL EXAM: General:  Well appearing. No resp difficulty. Ambulated in the clinic without difficulty.  HEENT: normal Neck: supple. JVP flat. Carotids 2+ bilaterally; no bruits. No lymphadenopathy or thryomegaly appreciated. Cor: PMI normal. Regular rate & rhythm. No rubs, gallops or murmurs. Lungs: clear Abdomen: obese, soft, nontender, nondistended. No hepatosplenomegaly. No bruits or masses. Good bowel sounds. Extremities: no cyanosis, clubbing, rash, edema Neuro: alert & orientedx3, cranial nerves grossly intact. Moves all 4 extremities w/o difficulty. Affect pleasant.  ASSESSMENT & PLAN: 1. Chronic Systolic HF:  Nonischemic cardiomyopathy, possible viral myocarditis.  Had cath 07/18/2014 without significant coronary disease. ECHO 06/2014 EF 20-25% => ECHO 5/16 EF 25-30%.  NYHA class II, does not appear volume overloaded.   Discussed ICD at last appointment.  He wants to try medical management for a bit longer before committing to this.   - Will get cardiac MRI to reassess EF and to look for evidence of myocarditis/infiltrative disease in 8/16.   - Continue lasix to 40 mg once daily  - Continue spiro 25 mg daily.   - Continue Coreg to 6.25 mg bid and lisinopril 2.5 qhs.  I do not think that he will tolerate higher doses.  - Continue digoxin 0.0625 mg daily, last dig level 0.2.   - With persistently high HR, will add Corlanor 5 mg bid.   - BMET/BNP today.  - Reinforced daily weights, low salt food choices, and limiting fluid to less than  2 liters per day.   2 . DM II: Stable.  Continue current regimen.   Follow up in 4 weeks.    Marca Ancona NP-C. 10:38 PM

## 2014-12-12 NOTE — Patient Instructions (Signed)
Start Corlanor 5 mg Twice daily   Labs today  Your physician recommends that you schedule a follow-up appointment in: 1 month

## 2014-12-13 ENCOUNTER — Other Ambulatory Visit (HOSPITAL_COMMUNITY): Payer: BLUE CROSS/BLUE SHIELD

## 2014-12-20 ENCOUNTER — Telehealth (HOSPITAL_COMMUNITY): Payer: Self-pay | Admitting: *Deleted

## 2014-12-20 NOTE — Telephone Encounter (Signed)
Completed PA for Corlanor, med was approved through 06/16/38, ref # [id number].  Have received info back from [facility] and the co-pay will be $50 for 30 day supply at retail pharmacy or $100 for 90 day supply from mail order pharmacy.  Have attempted to contact pt and Left message to call back to discuss price

## 2014-12-21 NOTE — Telephone Encounter (Signed)
Spoke w/pt's wife, she states pt has enough samples to last until his next appt with [place], at that time if he is going to stay on that dose she would like rx sent to mail order pharmacy ([facility])

## 2014-12-29 ENCOUNTER — Encounter: Payer: Self-pay | Admitting: Cardiology

## 2015-01-11 ENCOUNTER — Ambulatory Visit (HOSPITAL_COMMUNITY)
Admission: RE | Admit: 2015-01-11 | Discharge: 2015-01-11 | Disposition: A | Discharge status: Home / Self Care | Payer: BLUE CROSS/BLUE SHIELD | Source: Ambulatory Visit | Attending: Internal Medicine | Admitting: Internal Medicine

## 2015-01-11 ENCOUNTER — Encounter (HOSPITAL_COMMUNITY): Payer: Self-pay

## 2015-01-11 VITALS — BP 116/76 | HR 88 | Wt 217.4 lb

## 2015-01-11 DIAGNOSIS — Z833 Family history of diabetes mellitus: Secondary | ICD-10-CM | POA: Insufficient documentation

## 2015-01-11 DIAGNOSIS — Z87891 Personal history of nicotine dependence: Secondary | ICD-10-CM | POA: Insufficient documentation

## 2015-01-11 DIAGNOSIS — Z79899 Other long term (current) drug therapy: Secondary | ICD-10-CM | POA: Insufficient documentation

## 2015-01-11 DIAGNOSIS — Z7982 Long term (current) use of aspirin: Secondary | ICD-10-CM | POA: Insufficient documentation

## 2015-01-11 DIAGNOSIS — E119 Type 2 diabetes mellitus without complications: Secondary | ICD-10-CM | POA: Diagnosis not present

## 2015-01-11 DIAGNOSIS — Z9981 Dependence on supplemental oxygen: Secondary | ICD-10-CM | POA: Insufficient documentation

## 2015-01-11 DIAGNOSIS — I429 Cardiomyopathy, unspecified: Secondary | ICD-10-CM | POA: Insufficient documentation

## 2015-01-11 DIAGNOSIS — J449 Chronic obstructive pulmonary disease, unspecified: Secondary | ICD-10-CM | POA: Diagnosis not present

## 2015-01-11 DIAGNOSIS — I5041 Acute combined systolic (congestive) and diastolic (congestive) heart failure: Secondary | ICD-10-CM

## 2015-01-11 DIAGNOSIS — I5022 Chronic systolic (congestive) heart failure: Secondary | ICD-10-CM | POA: Insufficient documentation

## 2015-01-11 MED ORDER — CARVEDILOL 6.25 MG PO TABS: 9.3750 mg | ORAL_TABLET | Freq: Two times a day (BID) | ORAL | Status: DC

## 2015-01-11 NOTE — Progress Notes (Signed)
Patient ID: [name], male   DOB: August 07, 1950, 64 y.o.   MRN: [id number] PCP: Dr. Kirtland Bouchard. Clelia Croft  HPI: [name] is a 64 year old with history of NICM, chronic systolic heart failure,COPD on home oxygen, and DM II was admitted in 1/16 with chest pain and volume overload. Had an ECHO that showed EF 20-25%. Underwent cath as noted below. He was discharged on low dose bb, ace, dig, lasix, and spiro.   He returns for follow up.  At last visit, corlanor 5 mg twice a day added. Says he feels better on corlanor. Overall feeling pretty good. Denies SOB/PND/Orthopnea. Minimal edema. SBP  110-120. HR 80-90s . Walking every day. SBP Following low salt diet and limiting fluid intake     CPX 11/08/2014 Peak VO2: 17.8 (70.8% predicted peak VO2)  VE/VCO2 slope: 25.3  OUES: 2.27 Peak RER:  1.00  RHC/LHC 07/18/2014 RA: 5 RV: 29/8/9 PA: 30/11 (21) PCWP: 16 Fick CO/CI: 6.0/3.0 PVR: 0.8 WU SVR: 1042 Ao sat: 94% PA sats: 73%, 74% SVC sat: 72% Large vessel. Wraps apex. Small D1. Large D2. 20-30% plaque in proximal and distal LAD and D2  ECHO 07/15/14 EF 20-25% Grade III DD ECHO 10/18/2014 EF 25-30%, diffuse hypokinesis, mildly decreased RV systolic function with normal RV size.   Labs 07/13/2014 BNP 877 Labs 07/19/2014 K 4.5 Creatinine 1.12 Labs 07/25/2014 Dig level 1.7./   Labs 08/16/2014 K 4.5 Creatinine 1.07  Labs 09/22/2014: K 3.8 creatinine 1.14 Dig level 0.2  Labs 10/26/2014: K 4.2 Creatinine 1.09   Labs 6/16: K 4.6, creatinine 1.26 Labs 12/12/14: K 4.3 Creatinine 1.09  Social History: He lives in [place] with his wife. Quit smoking many years ago. No alcohol use. No illicit drug use. Usually independent with daily activities.  Family History: Diabetes.  No CHF or CAD.  ROS: All systems negative except as listed in HPI, PMH and Problem List.  SH:  History   Social History  . Marital Status: Married    Spouse Name: N/A  . Number of Children: N/A  . Years of Education: N/A   Occupational History  .  Not on file.   Social History Main Topics  . Smoking status: Former Smoker    Start date: 10/24/2010  . Smokeless tobacco: Not on file  . Alcohol Use: Yes  . Drug Use: No  . Sexual Activity: Not on file   Other Topics Concern  . Not on file   Social History Narrative    Past Medical History  Diagnosis Date  . COPD (chronic obstructive pulmonary disease)   . Obesity   . Diabetes mellitus, type II   . Chronic systolic CHF (congestive heart failure)     Current Outpatient Prescriptions  Medication Sig Dispense Refill  . aspirin 81 MG EC tablet Take 1 tablet (81 mg total) by mouth daily. 30 tablet 3  . carvedilol (COREG) 6.25 MG tablet Take 1 tablet (6.25 mg total) by mouth 2 (two) times daily with a meal. 60 tablet 6  . digoxin 62.5 MCG TABS Take 0.0625 mg by mouth daily. 90 tablet 3  . docusate sodium (COLACE) 100 MG capsule Take 1 capsule (100 mg total) by mouth 2 (two) times daily. constipation 60 capsule 1  . furosemide (LASIX) 40 MG tablet Take 1 tablet (40 mg total) by mouth daily. Take an extra 40 mg for weight 205 lbs or greater 60 tablet 6  . ivabradine (CORLANOR) 5 MG TABS tablet Take 5 mg by mouth 2 (  two) times daily with a meal.    . lisinopril (PRINIVIL,ZESTRIL) 5 MG tablet Take 0.5 tablets (2.5 mg total) by mouth every evening. 30 tablet 4  . metFORMIN (GLUCOPHAGE) 500 MG tablet Take 1 tablet (500 mg total) by mouth daily with breakfast. (Patient taking differently: Take 500 mg by mouth 2 (two) times daily with a meal. ) 30 tablet 3  . pantoprazole (PROTONIX) 40 MG tablet Take 1 tablet (40 mg total) by mouth daily. 30 tablet 3  . simethicone (MYLICON) 80 MG chewable tablet Chew 1 tablet (80 mg total) by mouth every 6 (six) hours as needed for flatulence. 30 tablet 0  . sitaGLIPtin (JANUVIA) 100 MG tablet Take 1 tablet (100 mg total) by mouth daily. 30 tablet 3  . spironolactone (ALDACTONE) 25 MG tablet Take 1 tablet (25 mg total) by mouth daily. 30 tablet 6  .  albuterol (PROVENTIL HFA;VENTOLIN HFA) 108 (90 BASE) MCG/ACT inhaler Inhale 1 puff into the lungs every 6 (six) hours as needed for wheezing or shortness of breath (sometimes allergy).     No current facility-administered medications for this encounter.    Filed Vitals:   01/11/15 1034  BP: 116/76  Pulse: 88  Weight: 217 lb 6.4 oz (98.612 kg)  SpO2: 94%    PHYSICAL EXAM: General:  Well appearing. No resp difficulty. Ambulated in the clinic without difficulty.  HEENT: normal Neck: supple. JVP flat. Carotids 2+ bilaterally; no bruits. No lymphadenopathy or thryomegaly appreciated. Cor: PMI normal. Regular rate & rhythm. No rubs, gallops or murmurs. Lungs: clear Abdomen: obese, soft, nontender, nondistended. No hepatosplenomegaly. No bruits or masses. Good bowel sounds. Extremities: no cyanosis, clubbing, rash, edema Neuro: alert & orientedx3, cranial nerves grossly intact. Moves all 4 extremities w/o difficulty. Affect pleasant.  ASSESSMENT & PLAN:  1. Chronic Systolic HF:  Nonischemic cardiomyopathy, possible viral myocarditis.  Had cath 07/18/2014 without significant coronary disease. ECHO 06/2014 EF 20-25% => ECHO 5/16 EF 25-30%.  NYHA class II, does not appear volume overloaded.   Discussed ICD at last appointment.  He wants to try medical management for a bit longer before committing to this.   - CcRI next week.  EF and to look for evidence of myocarditis/infiltrative disease in 8/16.   - Continue lasix to 40 mg once daily  - Continue spiro 25 mg daily.   - Increase  Coreg to 9.375 mg bid  -Continue lisinopril 2.5 qhs.  Does not tolerate higher doses.   - Continue digoxin 0.0625 mg daily, last dig level 0.2.   - HR 88. Continue Corlanor 5 mg twice a day.    - Reinforced daily weights, low salt food choices, and limiting fluid to less than 2 liters per day.   2 . DM II: Stable.  Continue current regimen.   Follow up in 8  weeks.    CLEGG,AMY NP-C. 10:51 AM    Patient seen and  examined with Tonye Becket, NP. We discussed all aspects of the encounter. I agree with the assessment and plan as stated above.   Echo done at bedside. EF seems to be improving. Now 30-35%. NYHA II. Volume status minimally elevated. Will increase carvedilol. cMRI pending. Reinforced need for daily weights and reviewed use of sliding scale diuretics.  Dnasia Gauna,MD 11:59 AM

## 2015-01-11 NOTE — Patient Instructions (Signed)
INCREASE Coreg to 9.375mg , one and one-half tab twice a day  Your physician recommends that you schedule a follow-up appointment in: 1 month  Do the following things EVERYDAY: 1) Weigh yourself in the morning before breakfast. Write it down and keep it in a log. 2) Take your medicines as prescribed 3) Eat low salt foods-Limit salt (sodium) to 2000 mg per day.  4) Stay as active as you can everyday 5) Limit all fluids for the day to less than 2 liters.

## 2015-01-18 ENCOUNTER — Ambulatory Visit (HOSPITAL_COMMUNITY)
Admission: RE | Admit: 2015-01-18 | Discharge: 2015-01-18 | Disposition: A | Discharge status: Home / Self Care | Payer: BLUE CROSS/BLUE SHIELD | Source: Ambulatory Visit | Attending: Cardiology | Admitting: Cardiology

## 2015-01-18 DIAGNOSIS — I420 Dilated cardiomyopathy: Secondary | ICD-10-CM

## 2015-01-24 ENCOUNTER — Telehealth (HOSPITAL_COMMUNITY): Payer: Self-pay | Admitting: *Deleted

## 2015-01-24 ENCOUNTER — Other Ambulatory Visit (HOSPITAL_COMMUNITY): Payer: Self-pay | Admitting: *Deleted

## 2015-01-24 ENCOUNTER — Ambulatory Visit (HOSPITAL_COMMUNITY)
Admission: RE | Admit: 2015-01-24 | Discharge: 2015-01-24 | Disposition: A | Discharge status: Home / Self Care | Payer: BLUE CROSS/BLUE SHIELD | Source: Ambulatory Visit | Attending: Cardiology | Admitting: Cardiology

## 2015-01-24 DIAGNOSIS — I340 Nonrheumatic mitral (valve) insufficiency: Secondary | ICD-10-CM | POA: Insufficient documentation

## 2015-01-24 DIAGNOSIS — I429 Cardiomyopathy, unspecified: Secondary | ICD-10-CM | POA: Diagnosis not present

## 2015-01-24 DIAGNOSIS — I420 Dilated cardiomyopathy: Secondary | ICD-10-CM | POA: Diagnosis not present

## 2015-01-24 MED ORDER — IVABRADINE HCL 5 MG PO TABS: 5.0000 mg | ORAL_TABLET | Freq: Two times a day (BID) | ORAL | Status: DC

## 2015-01-24 MED ORDER — GADOBENATE DIMEGLUMINE 529 MG/ML IV SOLN
35.0000 mL | Freq: Once | INTRAVENOUS | Status: AC
  Administered 2015-01-24: 35 mL via INTRAVENOUS

## 2015-01-24 NOTE — Telephone Encounter (Signed)
pts wife called and requested a 90 day supply [facility]. rx sent in.

## 2015-01-27 ENCOUNTER — Other Ambulatory Visit (HOSPITAL_COMMUNITY): Payer: Self-pay | Admitting: *Deleted

## 2015-01-27 MED ORDER — IVABRADINE HCL 5 MG PO TABS: 5.0000 mg | ORAL_TABLET | Freq: Two times a day (BID) | ORAL | Status: DC

## 2015-02-13 ENCOUNTER — Encounter (HOSPITAL_COMMUNITY): Payer: Self-pay

## 2015-02-13 ENCOUNTER — Ambulatory Visit (HOSPITAL_COMMUNITY)
Admission: RE | Admit: 2015-02-13 | Discharge: 2015-02-13 | Disposition: A | Discharge status: Home / Self Care | Payer: BLUE CROSS/BLUE SHIELD | Source: Ambulatory Visit | Attending: Cardiology | Admitting: Cardiology

## 2015-02-13 VITALS — BP 125/59 | HR 88 | Resp 18 | Wt 218.2 lb

## 2015-02-13 DIAGNOSIS — E119 Type 2 diabetes mellitus without complications: Secondary | ICD-10-CM | POA: Insufficient documentation

## 2015-02-13 DIAGNOSIS — I5022 Chronic systolic (congestive) heart failure: Secondary | ICD-10-CM | POA: Diagnosis not present

## 2015-02-13 DIAGNOSIS — Z7982 Long term (current) use of aspirin: Secondary | ICD-10-CM | POA: Insufficient documentation

## 2015-02-13 DIAGNOSIS — I428 Other cardiomyopathies: Secondary | ICD-10-CM | POA: Insufficient documentation

## 2015-02-13 DIAGNOSIS — Z833 Family history of diabetes mellitus: Secondary | ICD-10-CM | POA: Insufficient documentation

## 2015-02-13 DIAGNOSIS — Z9981 Dependence on supplemental oxygen: Secondary | ICD-10-CM | POA: Diagnosis not present

## 2015-02-13 DIAGNOSIS — E785 Hyperlipidemia, unspecified: Secondary | ICD-10-CM | POA: Insufficient documentation

## 2015-02-13 DIAGNOSIS — Z79899 Other long term (current) drug therapy: Secondary | ICD-10-CM | POA: Insufficient documentation

## 2015-02-13 DIAGNOSIS — J449 Chronic obstructive pulmonary disease, unspecified: Secondary | ICD-10-CM | POA: Diagnosis not present

## 2015-02-13 MED ORDER — LISINOPRIL 5 MG PO TABS: 2.5000 mg | ORAL_TABLET | Freq: Two times a day (BID) | ORAL | Status: DC

## 2015-02-13 NOTE — Patient Instructions (Addendum)
INCREASE Lisinopril to 2.5 mg (1/2 tab) TWICE daily. Pharmacy: [facility] - [place], [place] - [phone] [Patient Preferred] [phone]  Return in 1-2 weeks for lab work.  Will schedule you for an echocardiogram at [facility]. Address: [address]), [place], [place] [address]  Phone: [phone]  Follow up 6 weeks.  Do the following things EVERYDAY: 1) Weigh yourself in the morning before breakfast. Write it down and keep it in a log. 2) Take your medicines as prescribed 3) Eat low salt foods-Limit salt (sodium) to 2000 mg per day.  4) Stay as active as you can everyday 5) Limit all fluids for the day to less than 2 liters

## 2015-02-13 NOTE — Progress Notes (Signed)
Patient ID: [name], male   DOB: 1950-07-30, 64 y.o.   MRN: [id number] PCP: Dr. Kirtland [name]. [name] HF Cardiologist: [name]  HPI: [name] is a 64 year old with history of NICM, chronic systolic heart failure,COPD on home oxygen, and DM II was admitted in 1/16 with chest pain and volume overload. Had an ECHO that showed EF 20-25%. Underwent cath as noted below. He was discharged on low dose bb, ace, dig, lasix, and spiro.   He returns for follow up.  He continues to do well in general.  He has not been walking much in the heat but denies dyspnea walking on flat ground.  He reports fatigue walking up a hill. No chest pain.  No orthopnea/PND.     CPX 11/08/2014 Peak VO2: 17.8 (70.8% predicted peak VO2)  VE/VCO2 slope: 25.3  OUES: 2.27 Peak RER: 1.00  RHC/LHC 07/18/2014 RA: 5 RV: 29/8/9 PA: 30/11 (21) PCWP: 16 Fick CO/CI: 6.0/3.0 PVR: 0.8 WU SVR: 1042 Ao sat: 94% PA sats: 73%, 74% SVC sat: 72% Large vessel. Wraps apex. Small D1. Large D2. 20-30% plaque in proximal and distal LAD and D2  ECHO 07/15/14 EF 20-25% Grade III DD ECHO 10/18/2014 EF 25-30%, diffuse hypokinesis, mildly decreased RV systolic function with normal RV size.  cMRI 8/16 with EF 31%, moderate LVH, LGE at RV attachments (nonspecific, related to volume overload), normal RV size and systolic function (EF 43%).   Labs 07/13/2014 BNP 877 Labs 07/19/2014 K 4.5 Creatinine 1.12 Labs 07/25/2014 Dig level 1.7./   Labs 08/16/2014 K 4.5 Creatinine 1.07  Labs 09/22/2014: K 3.8 creatinine 1.14 Dig level 0.2  Labs 10/26/2014: K 4.2 Creatinine 1.09   Labs 6/16: K 4.6, creatinine 1.26 Labs 12/12/14: K 4.3 Creatinine 1.09 Labs 7/16: K 4.6, creatinine 1.04, AST 44, ALT 77, total cholesterol 205  Social History: He lives in [name] with his wife. Quit smoking many years ago. No alcohol use. No illicit drug use. Usually independent with daily activities.  Family History: Diabetes.  No CHF or CAD.  ROS: All systems negative except as listed in HPI,  PMH and Problem List.   Past Medical History  Diagnosis Date  . COPD (chronic obstructive pulmonary disease)   . Obesity   . Diabetes mellitus, type II   . Chronic systolic CHF (congestive heart failure)     Current Outpatient Prescriptions  Medication Sig Dispense Refill  . albuterol ([name]) 108 (90 BASE) MCG/ACT inhaler Inhale 1 puff into the lungs every 6 (six) hours as needed for wheezing or shortness of breath (sometimes allergy).    Marland Kitchen aspirin 81 MG EC tablet Take 1 tablet (81 mg total) by mouth daily. 30 tablet 3  . carvedilol (COREG) 6.25 MG tablet Take 1.5 tablets (9.375 mg total) by mouth 2 (two) times daily with a meal. 90 tablet 6  . digoxin (LANOXIN) 0.125 MG tablet Take 0.0625 mg by mouth daily.    Marland Kitchen docusate sodium (COLACE) 100 MG capsule Take 1 capsule (100 mg total) by mouth 2 (two) times daily. constipation 60 capsule 1  . furosemide (LASIX) 40 MG tablet Take 1 tablet (40 mg total) by mouth daily. Take an extra 40 mg for weight 205 lbs or greater 60 tablet 6  . ivabradine ([name]) 5 MG TABS tablet Take 1 tablet (5 mg total) by mouth 2 (two) times daily with a meal. 180 tablet 3  . lisinopril (PRINIVIL,ZESTRIL) 5 MG tablet Take 0.5 tablets (2.5 mg total) by mouth 2 (two)  times daily. 30 tablet 6  . metFORMIN (GLUCOPHAGE) 500 MG tablet Take 500 mg by mouth 2 (two) times daily with a meal.    . pantoprazole (PROTONIX) 40 MG tablet Take 1 tablet (40 mg total) by mouth daily. 30 tablet 3  . simethicone (MYLICON) 80 MG chewable tablet Chew 1 tablet (80 mg total) by mouth every 6 (six) hours as needed for flatulence. 30 tablet 0  . sitaGLIPtin (JANUVIA) 100 MG tablet Take 1 tablet (100 mg total) by mouth daily. 30 tablet 3  . spironolactone (ALDACTONE) 25 MG tablet Take 1 tablet (25 mg total) by mouth daily. 30 tablet 6   No current facility-administered medications for this encounter.    Filed Vitals:   02/13/15 0929  BP: 125/59  Pulse: 88  Resp: 18   Weight: 218 lb 4 oz (98.998 kg)  SpO2: 92%    PHYSICAL EXAM: General:  Well appearing. No resp difficulty. Ambulated in the clinic without difficulty.  HEENT: normal Neck: supple. JVP flat. Carotids 2+ bilaterally; no bruits. No lymphadenopathy or thryomegaly appreciated. Cor: PMI normal. Regular rate & rhythm. No rubs, gallops or murmurs. Lungs: clear Abdomen: obese, soft, nontender, nondistended. No hepatosplenomegaly. No bruits or masses. Good bowel sounds. Extremities: no cyanosis, clubbing, rash, edema Neuro: alert & orientedx3, cranial nerves grossly intact. Moves all 4 extremities w/o difficulty. Affect pleasant.  ASSESSMENT & PLAN:  1. Chronic Systolic HF:  Nonischemic cardiomyopathy, possible viral myocarditis.  Cardiac MRI in 8/16 without evidence for infiltrative disease or definite evidence for myocarditis (EF 31%).   Had cath 07/18/2014 without significant coronary disease. ECHO 06/2014 EF 20-25% => ECHO 5/16 EF 25-30%.  NYHA class II, does not appear volume overloaded.   - Continue current Lasix dose.  - Increase lisinopril to 2.5 mg bid. BMET in 10 days.   - Coreg 9.375 bid, spironolactone 25 mg daily, and ivabradine 5 mg bid to continue.  - Continue digoxin, check level with labs in 10 days.  - [name].  He wants to wait a few more months with medical treatment.  I will repeat echo in 11/16.  If EF remains low, he will need Gates.  Narrow QRS, not CRT candidate.  - Reinforced daily weights, low salt food choices, and limiting fluid to less than 2 liters per day.   2.  Hyperlipidemia: Lipids were elevated in 7/16.  He wants to work on diet before taking a statin.  Will repeat lipids in 11/16 along with echo.   Follow up in 6  weeks.   [name] 02/13/2015

## 2015-02-13 NOTE — Progress Notes (Signed)
Advanced Heart Failure Medication Review by a Pharmacist  Does the patient  feel that his/her medications are working for him/her?  yes  Has the patient been experiencing any side effects to the medications prescribed?  no  Does the patient measure his/her own blood pressure or blood glucose at home?  no   Does the patient have any problems obtaining medications due to transportation or finances?   no  Understanding of regimen: good Understanding of indications: good Potential of compliance: good    Pharmacist comments: Mr. Milillo is a pleasant 64 yo M presenting with an up-to-date medication list and all of his medication bottles. He states that he has not required any additional lasix recently and has not missed any doses of his medications. He does not have any specific medication-related questions or concerns at this time.  Tyler Deis. Bonnye Fava, PharmD, BCPS, CPP Clinical Pharmacist Pager: [phone] Phone: [phone] 02/13/2015 9:35 AM

## 2015-02-22 ENCOUNTER — Ambulatory Visit (HOSPITAL_COMMUNITY)
Admission: RE | Admit: 2015-02-22 | Discharge: 2015-02-22 | Disposition: A | Discharge status: Home / Self Care | Payer: BLUE CROSS/BLUE SHIELD | Source: Ambulatory Visit | Attending: Internal Medicine | Admitting: Internal Medicine

## 2015-02-22 DIAGNOSIS — I5022 Chronic systolic (congestive) heart failure: Secondary | ICD-10-CM | POA: Insufficient documentation

## 2015-02-22 LAB — BASIC METABOLIC PANEL
Anion gap: 10 (ref 5–15)
BUN: 15 mg/dL (ref 6–20)
CALCIUM: 9.3 mg/dL (ref 8.9–10.3)
CO2: 24 mmol/L (ref 22–32)
CREATININE: 1.09 mg/dL (ref 0.61–1.24)
Chloride: 95 mmol/L — ABNORMAL LOW (ref 101–111)
GFR calc Af Amer: >60 mL/min (ref >60)
GLUCOSE: 224 mg/dL — AB (ref 65–99)
Potassium: 4.1 mmol/L (ref 3.5–5.1)
Sodium: 129 mmol/L — ABNORMAL LOW (ref 135–145)

## 2015-02-22 LAB — DIGOXIN LEVEL: Digoxin Level: 0.3 ng/mL — ABNORMAL LOW (ref 0.8–2.0)

## 2015-03-13 ENCOUNTER — Other Ambulatory Visit (HOSPITAL_COMMUNITY): Payer: Self-pay | Admitting: Internal Medicine

## 2015-03-14 ENCOUNTER — Other Ambulatory Visit (HOSPITAL_COMMUNITY): Payer: Self-pay | Admitting: *Deleted

## 2015-03-14 MED ORDER — ASPIRIN 81 MG PO TBEC: 81.0000 mg | DELAYED_RELEASE_TABLET | Freq: Every day | ORAL | Status: DC

## 2015-03-30 ENCOUNTER — Ambulatory Visit (HOSPITAL_COMMUNITY)
Admission: RE | Admit: 2015-03-30 | Discharge: 2015-03-30 | Disposition: A | Discharge status: Home / Self Care | Payer: BLUE CROSS/BLUE SHIELD | Source: Ambulatory Visit | Attending: Cardiology | Admitting: Cardiology

## 2015-03-30 VITALS — BP 96/62 | HR 78 | Ht 67.0 in | Wt 211.8 lb

## 2015-03-30 DIAGNOSIS — Z9981 Dependence on supplemental oxygen: Secondary | ICD-10-CM | POA: Diagnosis not present

## 2015-03-30 DIAGNOSIS — I5022 Chronic systolic (congestive) heart failure: Secondary | ICD-10-CM

## 2015-03-30 DIAGNOSIS — Z7982 Long term (current) use of aspirin: Secondary | ICD-10-CM | POA: Insufficient documentation

## 2015-03-30 DIAGNOSIS — E669 Obesity, unspecified: Secondary | ICD-10-CM | POA: Insufficient documentation

## 2015-03-30 DIAGNOSIS — Z87891 Personal history of nicotine dependence: Secondary | ICD-10-CM | POA: Insufficient documentation

## 2015-03-30 DIAGNOSIS — I428 Other cardiomyopathies: Secondary | ICD-10-CM | POA: Insufficient documentation

## 2015-03-30 DIAGNOSIS — Z833 Family history of diabetes mellitus: Secondary | ICD-10-CM | POA: Diagnosis not present

## 2015-03-30 DIAGNOSIS — J449 Chronic obstructive pulmonary disease, unspecified: Secondary | ICD-10-CM | POA: Insufficient documentation

## 2015-03-30 DIAGNOSIS — Z79899 Other long term (current) drug therapy: Secondary | ICD-10-CM | POA: Insufficient documentation

## 2015-03-30 DIAGNOSIS — E119 Type 2 diabetes mellitus without complications: Secondary | ICD-10-CM | POA: Insufficient documentation

## 2015-03-30 DIAGNOSIS — Z7984 Long term (current) use of oral hypoglycemic drugs: Secondary | ICD-10-CM | POA: Insufficient documentation

## 2015-03-30 DIAGNOSIS — R197 Diarrhea, unspecified: Secondary | ICD-10-CM | POA: Diagnosis not present

## 2015-03-30 DIAGNOSIS — R1032 Left lower quadrant pain: Secondary | ICD-10-CM | POA: Insufficient documentation

## 2015-03-30 DIAGNOSIS — I5041 Acute combined systolic (congestive) and diastolic (congestive) heart failure: Secondary | ICD-10-CM

## 2015-03-30 LAB — BASIC METABOLIC PANEL
Anion gap: 12 (ref 5–15)
BUN: 14 mg/dL (ref 6–20)
CHLORIDE: 97 mmol/L — AB (ref 101–111)
CO2: 25 mmol/L (ref 22–32)
Calcium: 9.4 mg/dL (ref 8.9–10.3)
Creatinine, Ser: 1.12 mg/dL (ref 0.61–1.24)
GFR calc Af Amer: >60 mL/min (ref >60)
GFR calc non Af Amer: >60 mL/min (ref >60)
GLUCOSE: 305 mg/dL — AB (ref 65–99)
POTASSIUM: 4.3 mmol/L (ref 3.5–5.1)
Sodium: 134 mmol/L — ABNORMAL LOW (ref 135–145)

## 2015-03-30 LAB — BRAIN NATRIURETIC PEPTIDE: B NATRIURETIC PEPTIDE 5: 38.3 pg/mL (ref 0.0–100.0)

## 2015-03-30 MED ORDER — CARVEDILOL 12.5 MG PO TABS: 12.5000 mg | ORAL_TABLET | Freq: Two times a day (BID) | ORAL | Status: DC

## 2015-03-30 NOTE — Patient Instructions (Signed)
INCREASE Coreg to 12.5 mg, one tab twice a day  Labs today  Your physician recommends that you schedule a follow-up appointment in: 1 month with a echocardiogram  Your physician has requested that you have an echocardiogram. Echocardiography is a painless test that uses sound waves to create images of your heart. It provides your doctor with information about the size and shape of your heart and how well your heart's chambers and valves are working. This procedure takes approximately one hour. There are no restrictions for this procedure.  Do the following things EVERYDAY: 1) Weigh yourself in the morning before breakfast. Write it down and keep it in a log. 2) Take your medicines as prescribed 3) Eat low salt foods-Limit salt (sodium) to 2000 mg per day.  4) Stay as active as you can everyday 5) Limit all fluids for the day to less than 2 liters 6)

## 2015-03-31 NOTE — Progress Notes (Signed)
Patient ID: [name], male   DOB: [birth year], 64 y.o.   MRN: [id number] PCP: Dr. Kirtland Bouchard. [place]  HPI: [name] is a 64 year old with history of NICM, chronic systolic heart failure,COPD on home oxygen, and DM II was admitted in 1/16 with chest pain and volume overload. Had an ECHO that showed EF 20-25%. Underwent cath as noted below. He was discharged on low dose bb, ace, dig, lasix, and spiro.  Most recent study was cardiac MRI in 8/16 with EF remaining 31%.    He returns for follow up.  Overall feeling pretty good. Denies SOB except with heavy exertion.  No PND/orthopnea. Minimal edema. SBP 110-120 at home though lower here today. Walking every day. Main problem recently has been > 1 week of LLQ abdominal pain + diarrhea.  This improved over the last 2 days but is still present to a lesser extent.  He has been holding his Lasix during this GI illness.  Weight is down 7 lbs.     CPX 11/08/2014 Peak VO2: 17.8 (70.8% predicted peak VO2)  VE/VCO2 slope: 25.3  OUES: 2.27 Peak RER:  1.00  RHC/LHC 07/18/2014 RA: 5 RV: 29/8/9 PA: 30/11 (21) PCWP: 16 Fick CO/CI: 6.0/3.0 PVR: 0.8 WU SVR: 1042 Ao sat: 94% PA sats: 73%, 74% SVC sat: 72% Large vessel. Wraps apex. Small D1. Large D2. 20-30% plaque in proximal and distal LAD and D2  ECHO 07/15/14 EF 20-25% Grade III DD ECHO 10/18/2014 EF 25-30%, diffuse hypokinesis, mildly decreased RV systolic function with normal RV size.  Cardiac MRI (8/16) with EF 31%, moderate LVH, normal RV size with mildly decreased systolic function, LGE at the RV insertion sites (nonspecific, suggestive of volume overload).   Labs 07/13/2014 BNP 877 Labs 07/19/2014 K 4.5 Creatinine 1.12 Labs 07/25/2014 Dig level 1.7./   Labs 08/16/2014 K 4.5 Creatinine 1.07  Labs 09/22/2014: K 3.8 creatinine 1.14 Dig level 0.2  Labs 10/26/2014: K 4.2 Creatinine 1.09   Labs 6/16: K 4.6, creatinine 1.26 Labs 12/12/14: K 4.3 Creatinine 1.09 Labs 9/16: K 4.1, Na 129, creatinine 1.09, digoxin  0.3  Social History: He lives in [place] with his wife. Quit smoking many years ago. No alcohol use. No illicit drug use. Usually independent with daily activities.  Family History: Diabetes.  No CHF or CAD.  ROS: All systems negative except as listed in HPI, PMH and Problem List.  SH:  Social History   Social History  . Marital Status: Married    Spouse Name: N/A  . Number of Children: N/A  . Years of Education: N/A   Occupational History  . Not on file.   Social History Main Topics  . Smoking status: Former Smoker    Start date: 10/24/2010  . Smokeless tobacco: Not on file  . Alcohol Use: Yes  . Drug Use: No  . Sexual Activity: Not on file   Other Topics Concern  . Not on file   Social History Narrative    Past Medical History  Diagnosis Date  . COPD (chronic obstructive pulmonary disease)   . Obesity   . Diabetes mellitus, type II   . Chronic systolic CHF (congestive heart failure)     Current Outpatient Prescriptions  Medication Sig Dispense Refill  . aspirin 81 MG EC tablet Take 1 tablet (81 mg total) by mouth daily. 30 tablet 3  . carvedilol (COREG) 12.5 MG tablet Take 1 tablet (12.5 mg total) by mouth 2 (two) times daily with a meal. 60 tablet  6  . digoxin (LANOXIN) 0.125 MG tablet Take 0.0625 mg by mouth daily.    . ivabradine (CORLANOR) 5 MG TABS tablet Take 1 tablet (5 mg total) by mouth 2 (two) times daily with a meal. 180 tablet 3  . lisinopril (PRINIVIL,ZESTRIL) 5 MG tablet Take 0.5 tablets (2.5 mg total) by mouth 2 (two) times daily. 30 tablet 6  . metFORMIN (GLUCOPHAGE) 500 MG tablet Take 500 mg by mouth 2 (two) times daily with a meal.    . pantoprazole (PROTONIX) 40 MG tablet TAKE 1 TABLET (40 MG TOTAL) BY MOUTH DAILY. 30 tablet 3  . sitaGLIPtin (JANUVIA) 100 MG tablet Take 1 tablet (100 mg total) by mouth daily. 30 tablet 3  . spironolactone (ALDACTONE) 25 MG tablet Take 1 tablet (25 mg total) by mouth daily. 30 tablet 6  . albuterol (PROVENTIL  HFA;VENTOLIN HFA) 108 (90 BASE) MCG/ACT inhaler Inhale 1 puff into the lungs every 6 (six) hours as needed for wheezing or shortness of breath (sometimes allergy).    Marland Kitchen docusate sodium (COLACE) 100 MG capsule Take 1 capsule (100 mg total) by mouth 2 (two) times daily. constipation (Patient not taking: Reported on 03/30/2015) 60 capsule 1  . furosemide (LASIX) 40 MG tablet Take 1 tablet (40 mg total) by mouth daily. Take an extra 40 mg for weight 205 lbs or greater (Patient not taking: Reported on 03/30/2015) 60 tablet 6  . simethicone (MYLICON) 80 MG chewable tablet Chew 1 tablet (80 mg total) by mouth every 6 (six) hours as needed for flatulence. (Patient not taking: Reported on 03/30/2015) 30 tablet 0   No current facility-administered medications for this encounter.    Filed Vitals:   03/30/15 0958  BP: 96/62  Pulse: 78  Height: 5\' 7"  (1.702 m)  Weight: 211 lb 12.8 oz (96.072 kg)  SpO2: 95%    PHYSICAL EXAM: General:  Well appearing. No resp difficulty. Ambulated in the clinic without difficulty.  HEENT: normal Neck: supple. JVP flat. Carotids 2+ bilaterally; no bruits. No lymphadenopathy or thryomegaly appreciated. Cor: PMI normal. Regular rate & rhythm. No rubs, gallops or murmurs. Lungs: clear Abdomen: obese, soft, mild LLQ tenderness without rebound or guarding, nondistended. No hepatosplenomegaly. No bruits or masses. Good bowel sounds. Extremities: no cyanosis, clubbing, rash, edema Neuro: alert & orientedx3, cranial nerves grossly intact. Moves all 4 extremities w/o difficulty. Affect pleasant.  ASSESSMENT & PLAN:  1. 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  Had cath 07/18/2014 without significant coronary disease. ECHO 06/2014 EF 20-25% => ECHO 5/16 EF 25-30%.  Cardiac MRI in 8/16 with EF 31%, RV insertion site LGE pattern, nonspecific.  NYHA class II, does not appear volume overloaded.   - Discussed ICD at a prior appointment.  He wants  to try medical management for a bit longer before committing to this. Therefore, will repeat echo in 11/16.  If EF remains low, will again recommned ICD.  Narrow QRS so not CRT candidate.   - Restart Lasix 40 mg daily when GI illness is resolved.  - Continue spiro 25 mg daily.   - Increase Coreg to 12.5 mg bid in 5-6 days when GI illness has completely recovered.  - Continue lisinopril 2.5 bid.   - Continue digoxin 0.0625 mg daily, recent level was ok.   - Continue Corlanor 5 mg twice a day.    - BMET/BNP today.  - Reinforced daily weights, low salt food choices, and limiting fluid to less than 2 liters per day.  2 . Diarrhea/LLQ pain: Possible viral gastroenteritis.  Cannot rule out diverticulitis.  If LLQ pain does not fully resolve in a day or 2, needs workup for diverticulitis.   Follow up in 8  weeks.    Marca Ancona NP-C. 12:02 AM    Patient seen and examined with Tonye Becket, NP. We discussed all aspects of the encounter. I agree with the assessment and plan as stated above.   Echo done at bedside. EF seems to be improving. Now 30-35%. NYHA II. Volume status minimally elevated. Will increase carvedilol. cMRI pending. Reinforced need for daily weights and reviewed use of sliding scale diuretics.  Dalton McLean,MD 12:02 AM

## 2015-04-12 ENCOUNTER — Encounter: Payer: Self-pay | Admitting: Cardiology

## 2015-04-18 ENCOUNTER — Ambulatory Visit (HOSPITAL_COMMUNITY): Payer: BLUE CROSS/BLUE SHIELD

## 2015-04-25 ENCOUNTER — Telehealth (HOSPITAL_COMMUNITY): Payer: Self-pay

## 2015-04-26 NOTE — Telephone Encounter (Signed)
No notes needed

## 2015-05-01 ENCOUNTER — Encounter (HOSPITAL_COMMUNITY): Payer: Self-pay

## 2015-05-01 ENCOUNTER — Ambulatory Visit (HOSPITAL_BASED_OUTPATIENT_CLINIC_OR_DEPARTMENT_OTHER)
Admission: RE | Admit: 2015-05-01 | Discharge: 2015-05-01 | Disposition: A | Discharge status: Home / Self Care | Payer: BLUE CROSS/BLUE SHIELD | Source: Ambulatory Visit | Attending: Cardiology | Admitting: Cardiology

## 2015-05-01 ENCOUNTER — Ambulatory Visit (HOSPITAL_COMMUNITY)
Admission: RE | Admit: 2015-05-01 | Discharge: 2015-05-01 | Disposition: A | Discharge status: Home / Self Care | Payer: BLUE CROSS/BLUE SHIELD | Source: Ambulatory Visit | Attending: Cardiology | Admitting: Cardiology

## 2015-05-01 VITALS — BP 110/64 | HR 89 | Wt 216.8 lb

## 2015-05-01 DIAGNOSIS — I509 Heart failure, unspecified: Secondary | ICD-10-CM | POA: Insufficient documentation

## 2015-05-01 DIAGNOSIS — I5022 Chronic systolic (congestive) heart failure: Secondary | ICD-10-CM

## 2015-05-01 DIAGNOSIS — I340 Nonrheumatic mitral (valve) insufficiency: Secondary | ICD-10-CM | POA: Diagnosis not present

## 2015-05-01 MED ORDER — LISINOPRIL 5 MG PO TABS: ORAL_TABLET | ORAL | Status: DC

## 2015-05-01 NOTE — Progress Notes (Signed)
Advanced Heart Failure Clinic Note   Patient ID: [name], male   DOB: 1951-05-16, 64 y.o.   MRN: [id number] PCP: Dr. Kirtland Bouchard. [facility]: Dr. Shirlee Latch  HPI: [name] is a 63 year old with history of NICM, chronic systolic heart failure,COPD on home oxygen, and DM II was admitted in 1/16 with chest pain and volume overload. Had an ECHO that showed EF 20-25%. Underwent cath as noted below. He was discharged on low dose bb, ace, dig, lasix, and spiro.  Most recent study was cardiac MRI in 8/16 with EF remaining 31%.    He returns for regular follow up. At last visit we increased his coreg, told him to change once his GI illness had passed.  Says he is more limited by joint pain than any SOB.  Recently laid 200 feet of hardwood flooring over 2 days and didn't have much difficulty, just very tired afterward.  Wife says he does occasional have SOB with heavy exertion. Overall feels good. No PND/orthopnea. Denies edema.  Holds lasix on days where he feels dry.  Says he has only been taking about 4-5 times a week. Continues to be moderately active.  Feels "worn out" when he first takes meds in the morning.    CPX 11/08/2014 Peak VO2: 17.8 (70.8% predicted peak VO2)  VE/VCO2 slope: 25.3  OUES: 2.27 Peak RER:  1.00  RHC/LHC 07/18/2014 RA: 5 RV: 29/8/9 PA: 30/11 (21) PCWP: 16 Fick CO/CI: 6.0/3.0 PVR: 0.8 WU SVR: 1042 Ao sat: 94% PA sats: 73%, 74% SVC sat: 72% Large vessel. Wraps apex. Small D1. Large D2. 20-30% plaque in proximal and distal LAD and D2  ECHO 07/15/14 EF 20-25% Grade III DD ECHO 10/18/2014 EF 25-30%, diffuse hypokinesis, mildly decreased RV systolic function with normal RV size.  Cardiac MRI (8/16) with EF 31%, moderate LVH, normal RV size with mildly decreased systolic function, LGE at the RV insertion sites (nonspecific, suggestive of volume overload).  Echo 05/01/15 EF 40% with diffuse hypokinesis.   Labs 07/13/2014 BNP 877 Labs 07/19/2014 K 4.5 Creatinine 1.12 Labs  07/25/2014 Dig level 1.7./   Labs 08/16/2014 K 4.5 Creatinine 1.07  Labs 09/22/2014: K 3.8 creatinine 1.14 Dig level 0.2  Labs 10/26/2014: K 4.2 Creatinine 1.09   Labs 6/16: K 4.6, creatinine 1.26 Labs 12/12/14: K 4.3 Creatinine 1.09 Labs 9/16: K 4.1, Na 129, creatinine 1.09, digoxin 0.3 Labs 10/16: K 4.7, creatinine 1.02, LDL 112, AST 47, ALT 65  Social History: He lives in [place] with his wife. Quit smoking many years ago. No alcohol use. No illicit drug use. Usually independent with daily activities.  Family History: Diabetes.  No CHF or CAD.  ROS: All systems negative except as listed in HPI, PMH and Problem List.  SH:  Social History   Social History  . Marital Status: Married    Spouse Name: N/A  . Number of Children: N/A  . Years of Education: N/A   Occupational History  . Not on file.   Social History Main Topics  . Smoking status: Former Smoker    Start date: 10/24/2010  . Smokeless tobacco: Not on file  . Alcohol Use: Yes  . Drug Use: No  . Sexual Activity: Not on file   Other Topics Concern  . Not on file   Social History Narrative    Past Medical History  Diagnosis Date  . COPD (chronic obstructive pulmonary disease) (HCC)   . Obesity   . Diabetes mellitus, type II (HCC)   .  Chronic systolic CHF (congestive heart failure) (HCC)     Current Outpatient Prescriptions  Medication Sig Dispense Refill  . albuterol (PROVENTIL HFA;VENTOLIN HFA) 108 (90 BASE) MCG/ACT inhaler Inhale 1 puff into the lungs every 6 (six) hours as needed for wheezing or shortness of breath (sometimes allergy).    Marland Kitchen aspirin 81 MG EC tablet Take 1 tablet (81 mg total) by mouth daily. 30 tablet 3  . carvedilol (COREG) 12.5 MG tablet Take 1 tablet (12.5 mg total) by mouth 2 (two) times daily with a meal. 60 tablet 6  . digoxin (LANOXIN) 0.125 MG tablet Take 0.0625 mg by mouth daily.    Marland Kitchen docusate sodium (COLACE) 100 MG capsule Take 1 capsule (100 mg total) by mouth 2 (two) times daily.  constipation 60 capsule 1  . furosemide (LASIX) 40 MG tablet Take 1 tablet (40 mg total) by mouth daily. Take an extra 40 mg for weight 205 lbs or greater 60 tablet 6  . glipiZIDE (GLUCOTROL) 5 MG tablet Take by mouth daily before breakfast.    . ivabradine (CORLANOR) 5 MG TABS tablet Take 1 tablet (5 mg total) by mouth 2 (two) times daily with a meal. 180 tablet 3  . lisinopril (PRINIVIL,ZESTRIL) 5 MG tablet Take 0.5 tablets (2.5 mg total) by mouth 2 (two) times daily. 30 tablet 6  . metFORMIN (GLUCOPHAGE) 500 MG tablet Take 1,000 mg by mouth 2 (two) times daily with a meal.     . pantoprazole (PROTONIX) 40 MG tablet TAKE 1 TABLET (40 MG TOTAL) BY MOUTH DAILY. 30 tablet 3  . rosuvastatin (CRESTOR) 20 MG tablet Take 20 mg by mouth daily.    . simethicone (MYLICON) 80 MG chewable tablet Chew 1 tablet (80 mg total) by mouth every 6 (six) hours as needed for flatulence. 30 tablet 0  . sitaGLIPtin (JANUVIA) 100 MG tablet Take 1 tablet (100 mg total) by mouth daily. 30 tablet 3  . spironolactone (ALDACTONE) 25 MG tablet Take 1 tablet (25 mg total) by mouth daily. 30 tablet 6   No current facility-administered medications for this encounter.    Filed Vitals:   05/01/15 1201  BP: 110/64  Pulse: 89  Weight: 216 lb 12 oz (98.317 kg)  SpO2: 99%    PHYSICAL EXAM: General:  Well appearing. No resp difficulty. Ambulated in the clinic without difficulty.  HEENT: normal Neck: supple. JVP ~6 cm. Carotids 2+ bilaterally; no bruits. No thyromegaly or nodule noted. Cor: PMI normal. RRR. No M/G/R Lungs: CTA, normal effort Abdomen: obese, soft, NT, ND, no HSM. No bruits or masses. +BS Extremities: no cyanosis, clubbing, rash. Trace ankle edema Neuro: alert & orientedx3, cranial nerves grossly intact. Moves all 4 extremities w/o difficulty. Affect pleasant.  ASSESSMENT & PLAN:  1. 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  Had cath 07/18/2014 without significant  coronary disease. ECHO 06/2014 EF 20-25% => ECHO 5/16 EF 25-30%.  Cardiac MRI in 8/16 with EF 31%, RV insertion site LGE pattern, nonspecific.  Echo today was reviewed. It showed some improvement in EF to 40%.  Currently out of range for ICD.  NYHA class II.   - Has gained several lbs but does not appear volume overloaded on exam. - Should take Lasix 40 mg daily (5-6 days a week at the least)  - Continue spiro 25 mg daily.   - Continue Coreg 12.5 mg bid  - Increase lisinopril to 2.5 mg am, and 5 mg qhs.  Slow titration with fatigue/occasional lightheadedness  after taking meds.  - Continue digoxin 0.0625 mg daily, check level with other labs in 10 days.  - Continue Corlanor 5 mg twice a day.    - BMET/BNP in 10 days after increasing lisinopril.  - Reinforced daily weights, low salt food choices, and limiting fluid to less than 2 liters per day.   - Recommended exercise bike for low impact activity. 2.  Hyperlipidemia: Continue Crestor.  Follow up in 2 months. BMET, BNP, Dig in 10 days.    Mariam Dollar Tillery PA-C. 12:12 PM   Patient seen with PA, agree with the above note. He is stable symptomatically, NYHA class II.  EF on echo today was 40%, appears to be out of ICD range . Volume status looks ok.  Will gently increase lisinopril as above.  BMET/BNP/digoxin in 10 days.   Marca Ancona 05/01/2015

## 2015-05-01 NOTE — Patient Instructions (Signed)
Medications:  Take Lisinopril 2.5 mg (1/2 tablet) in the am and 5 mg (1 tablet) in the evening  Labs next Wednesday  Follow up:  2 months with Dr Shirlee Latch

## 2015-05-10 ENCOUNTER — Ambulatory Visit (HOSPITAL_COMMUNITY)
Admission: RE | Admit: 2015-05-10 | Discharge: 2015-05-10 | Disposition: A | Discharge status: Home / Self Care | Payer: BLUE CROSS/BLUE SHIELD | Source: Ambulatory Visit | Attending: Internal Medicine | Admitting: Internal Medicine

## 2015-05-10 DIAGNOSIS — I5022 Chronic systolic (congestive) heart failure: Secondary | ICD-10-CM | POA: Diagnosis not present

## 2015-05-10 LAB — BASIC METABOLIC PANEL
ANION GAP: 10 (ref 5–15)
BUN: 30 mg/dL — ABNORMAL HIGH (ref 6–20)
CALCIUM: 9.2 mg/dL (ref 8.9–10.3)
CO2: 23 mmol/L (ref 22–32)
CREATININE: 1.08 mg/dL (ref 0.61–1.24)
Chloride: 100 mmol/L — ABNORMAL LOW (ref 101–111)
GFR calc Af Amer: >60 mL/min (ref >60)
GFR calc non Af Amer: >60 mL/min (ref >60)
GLUCOSE: 183 mg/dL — AB (ref 65–99)
Potassium: 4.8 mmol/L (ref 3.5–5.1)
Sodium: 133 mmol/L — ABNORMAL LOW (ref 135–145)

## 2015-05-10 LAB — DIGOXIN LEVEL: Digoxin Level: 0.4 ng/mL — ABNORMAL LOW (ref 0.8–2.0)

## 2015-05-10 LAB — BRAIN NATRIURETIC PEPTIDE: B Natriuretic Peptide: 8 pg/mL (ref 0.0–100.0)

## 2015-06-27 ENCOUNTER — Telehealth (HOSPITAL_COMMUNITY): Payer: Self-pay

## 2015-06-27 NOTE — Telephone Encounter (Signed)
Patient's wife left VM on CHF triage line wanting an update on status of FMLA paperwork and to make an apt (patinet is due to be seen this month). Left returning VM on patient's line that FMLA paperwork is currently being processed and to call back to speak to a scheduler to make his apt.  Ave Filter

## 2015-06-28 ENCOUNTER — Telehealth (HOSPITAL_COMMUNITY): Payer: Self-pay | Admitting: Vascular Surgery

## 2015-06-28 NOTE — Telephone Encounter (Signed)
Returned pt wife call to make appt w/ [facility]

## 2015-07-11 ENCOUNTER — Other Ambulatory Visit (HOSPITAL_COMMUNITY): Payer: Self-pay | Admitting: Internal Medicine

## 2015-07-21 ENCOUNTER — Other Ambulatory Visit (HOSPITAL_COMMUNITY): Payer: Self-pay | Admitting: Internal Medicine

## 2015-07-24 ENCOUNTER — Encounter (HOSPITAL_COMMUNITY): Payer: Self-pay

## 2015-07-24 ENCOUNTER — Ambulatory Visit (HOSPITAL_COMMUNITY)
Admission: RE | Admit: 2015-07-24 | Discharge: 2015-07-24 | Disposition: A | Discharge status: Home / Self Care | Payer: BLUE CROSS/BLUE SHIELD | Source: Ambulatory Visit | Attending: Cardiology | Admitting: Cardiology

## 2015-07-24 VITALS — BP 106/64 | HR 75 | Wt 216.2 lb

## 2015-07-24 DIAGNOSIS — Z7982 Long term (current) use of aspirin: Secondary | ICD-10-CM | POA: Diagnosis not present

## 2015-07-24 DIAGNOSIS — E119 Type 2 diabetes mellitus without complications: Secondary | ICD-10-CM | POA: Insufficient documentation

## 2015-07-24 DIAGNOSIS — Z79899 Other long term (current) drug therapy: Secondary | ICD-10-CM | POA: Insufficient documentation

## 2015-07-24 DIAGNOSIS — J449 Chronic obstructive pulmonary disease, unspecified: Secondary | ICD-10-CM | POA: Diagnosis not present

## 2015-07-24 DIAGNOSIS — Z7984 Long term (current) use of oral hypoglycemic drugs: Secondary | ICD-10-CM | POA: Diagnosis not present

## 2015-07-24 DIAGNOSIS — R197 Diarrhea, unspecified: Secondary | ICD-10-CM | POA: Diagnosis not present

## 2015-07-24 DIAGNOSIS — Z87891 Personal history of nicotine dependence: Secondary | ICD-10-CM | POA: Diagnosis not present

## 2015-07-24 DIAGNOSIS — I428 Other cardiomyopathies: Secondary | ICD-10-CM | POA: Diagnosis not present

## 2015-07-24 DIAGNOSIS — E669 Obesity, unspecified: Secondary | ICD-10-CM | POA: Diagnosis not present

## 2015-07-24 DIAGNOSIS — I5022 Chronic systolic (congestive) heart failure: Secondary | ICD-10-CM | POA: Insufficient documentation

## 2015-07-24 DIAGNOSIS — E785 Hyperlipidemia, unspecified: Secondary | ICD-10-CM | POA: Insufficient documentation

## 2015-07-24 DIAGNOSIS — I493 Ventricular premature depolarization: Secondary | ICD-10-CM | POA: Diagnosis not present

## 2015-07-24 DIAGNOSIS — Z9981 Dependence on supplemental oxygen: Secondary | ICD-10-CM | POA: Diagnosis not present

## 2015-07-24 MED ORDER — SPIRONOLACTONE 25 MG PO TABS: 25.0000 mg | ORAL_TABLET | Freq: Every evening | ORAL | Status: DC

## 2015-07-24 NOTE — Progress Notes (Signed)
Patient ID: [name], male   DOB: [birth year], 65 y.o.   MRN: [id number]   Advanced Heart Failure Clinic Note   PCP: Dr. Kirtland Bouchard. [facility]: Dr. Shirlee Latch  HPI: [name] is a 65 year old with history of NICM, chronic systolic heart failure,COPD on home oxygen, and DM II was admitted in 1/16 with chest pain and volume overload. Had an ECHO that showed EF 20-25%. Underwent cath as noted below. He was discharged on low dose bb, ace, dig, lasix, and spiro.  Most recent study was cardiac MRI in 8/16 with EF remaining 31%.    He returns for regular follow up. At last visit we increased his evening dose of lisinopril. Feels Ok overall. Says BP has been running "quite low", 98/61 last week.Only taking spiro 1/2 tab daily.  Breathing ok. No SOB on flat ground. Denies SOB with hills or stairs either, just gets "tired". No PND/orthopnea. Denies edema. Taking lasix every other day.  Continues to be very active.  Does occasionally have lightheadedness.  Holds Lasix on days he feels "dehydrated". On these days he feels like his eyes and skin are dry. Continues to have diarrhea. Has 1-2 times a month, and last for 3-4 days with 8-10 watery daily during that time. Has GI appt 08/03/15.     CPX 11/08/2014 Peak VO2: 17.8 (70.8% predicted peak VO2)  VE/VCO2 slope: 25.3  OUES: 2.27 Peak RER:  1.00  RHC/LHC 07/18/2014 RA: 5 RV: 29/8/9 PA: 30/11 (21) PCWP: 16 Fick CO/CI: 6.0/3.0 PVR: 0.8 WU SVR: 1042 Ao sat: 94% PA sats: 73%, 74% SVC sat: 72% Large vessel. Wraps apex. Small D1. Large D2. 20-30% plaque in proximal and distal LAD and D2  - ECHO 07/15/14 EF 20-25% Grade III DD - ECHO 10/18/2014 EF 25-30%, diffuse hypokinesis, mildly decreased RV systolic function with normal RV size.  - Cardiac MRI (8/16) with EF 31%, moderate LVH, normal RV size with mildly decreased systolic function, LGE at the RV insertion sites (nonspecific, suggestive of volume overload).  - Echo 05/01/15 EF 40% with diffuse hypokinesis.    Labs 07/13/2014 BNP 877 Labs 07/19/2014 K 4.5 Creatinine 1.12 Labs 07/25/2014 Dig level 1.7./   Labs 08/16/2014 K 4.5 Creatinine 1.07  Labs 09/22/2014: K 3.8 creatinine 1.14 Dig level 0.2  Labs 10/26/2014: K 4.2 Creatinine 1.09   Labs 6/16: K 4.6, creatinine 1.26 Labs 12/12/14: K 4.3 Creatinine 1.09 Labs 9/16: K 4.1, Na 129, creatinine 1.09, digoxin 0.3 Labs 10/16: K 4.7, creatinine 1.02, LDL 112, AST 47, ALT 65 Labs 1/17: K 4.5, creatinine 1.17, LDL 29, HDL 23  Social History: He lives in [place] with his wife. Quit smoking many years ago. No alcohol use. No illicit drug use. Usually independent with daily activities.  Family History: Diabetes.  No CHF or CAD.  ROS: All systems negative except as listed in HPI, PMH and Problem List.  SH:  Social History   Social History  . Marital Status: Married    Spouse Name: N/A  . Number of Children: N/A  . Years of Education: N/A   Occupational History  . Not on file.   Social History Main Topics  . Smoking status: Former Smoker    Start date: 10/24/2010  . Smokeless tobacco: Not on file  . Alcohol Use: Yes  . Drug Use: No  . Sexual Activity: Not on file   Other Topics Concern  . Not on file   Social History Narrative    Past Medical History  Diagnosis Date  . COPD (chronic obstructive pulmonary disease) (HCC)   . Obesity   . Diabetes mellitus, type II (HCC)   . Chronic systolic CHF (congestive heart failure) (HCC)     Current Outpatient Prescriptions  Medication Sig Dispense Refill  . aspirin 81 MG EC tablet TAKE 1 TABLET (81 MG TOTAL) BY MOUTH DAILY. 30 tablet 3  . carvedilol (COREG) 12.5 MG tablet Take 1 tablet (12.5 mg total) by mouth 2 (two) times daily with a meal. 60 tablet 6  . digoxin (LANOXIN) 0.125 MG tablet Take 0.0625 mg by mouth daily.    . furosemide (LASIX) 40 MG tablet Take 40 mg by mouth every other day.    Marland Kitchen glipiZIDE (GLUCOTROL) 10 MG tablet Take 10 mg by mouth 2 (two) times daily before a meal.    .  ivabradine (CORLANOR) 5 MG TABS tablet Take 1 tablet (5 mg total) by mouth 2 (two) times daily with a meal. 180 tablet 3  . lisinopril (PRINIVIL,ZESTRIL) 5 MG tablet Take 2.5 mg (1/2 tablet) in the morning and 5 mg (1 tablet) in the evening 90 tablet 3  . loperamide (IMODIUM A-D) 2 MG tablet Take 2 mg by mouth 4 (four) times daily as needed for diarrhea or loose stools.    . metFORMIN (GLUCOPHAGE) 500 MG tablet Take 1,000 mg by mouth 2 (two) times daily with a meal.     . pantoprazole (PROTONIX) 40 MG tablet TAKE 1 TABLET (40 MG TOTAL) BY MOUTH DAILY. 30 tablet 3  . rosuvastatin (CRESTOR) 20 MG tablet Take 20 mg by mouth daily.    . simethicone (MYLICON) 80 MG chewable tablet Chew 1 tablet (80 mg total) by mouth every 6 (six) hours as needed for flatulence. 30 tablet 0  . sitaGLIPtin (JANUVIA) 100 MG tablet Take 1 tablet (100 mg total) by mouth daily. 30 tablet 3  . spironolactone (ALDACTONE) 25 MG tablet Take 12.5 mg by mouth daily.    Marland Kitchen albuterol (PROVENTIL HFA;VENTOLIN HFA) 108 (90 BASE) MCG/ACT inhaler Inhale 1 puff into the lungs every 6 (six) hours as needed for wheezing or shortness of breath (sometimes allergy). Reported on 07/24/2015    . docusate sodium (COLACE) 100 MG capsule Take 100 mg by mouth 2 (two) times daily as needed for mild constipation. Reported on 07/24/2015     No current facility-administered medications for this encounter.    Filed Vitals:   07/24/15 0927  BP: 106/64  Pulse: 75  Weight: 216 lb 4 oz (98.09 kg)  SpO2: 97%   Wt Readings from Last 3 Encounters:  07/24/15 216 lb 4 oz (98.09 kg)  05/01/15 216 lb 12 oz (98.317 kg)  03/30/15 211 lb 12.8 oz (96.072 kg)     PHYSICAL EXAM: General:  Well appearing. NAD HEENT: normal Neck: supple. JVP 6-7 cm. Carotids 2+ bilaterally; no bruits. No thyromegaly or nodule noted. Cor: PMI normal. RRR. No M/G/R Lungs: Clear, normal effort Abdomen: obese, soft, non-tender, non-distended, no HSM. No bruits or masses.  +BS Extremities: no cyanosis, clubbing, rash. No edema Neuro: alert & orientedx3, cranial nerves grossly intact. Moves all 4 extremities w/o difficulty. Affect pleasant.  ASSESSMENT & PLAN:  1. 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  Had cath 07/18/2014 without significant coronary disease. ECHO 06/2014 EF 20-25% => ECHO 5/16 EF 25-30%.  Cardiac MRI in 8/16 with EF 31%, RV insertion site LGE pattern, nonspecific.  Echo in 11/16 showed some improvement in EF to 40%.  Currently  out of range for ICD.  NYHA class II.  Volume status stable. - Continue Lasix 40 mg every other day. Should hold on days he has diarrhea.  - Increase spiro to 25 mg daily.   - Continue Coreg 12.5 mg bid  - Continue lisinopril to 2.5mg  am/5 mg pm, slow titration with fatigue/occasional lightheadedness after taking meds.  - Continue digoxin 0.0625 mg daily, check level with next labs.  - Continue Corlanor 5 mg twice a day.    - BMET/BNP in 10-14 days with increase in spiro.  - Continue sliding scale diuretics. Can take daily for weight gains of 3 lbs overnight or 5 lbs within a week. Can hold if lightheaded/dizzy, feels dry.  - Reinforced daily weights, low salt food choices, and limiting fluid to less than 2 liters per day.   - Recommended exercise bike for low impact activity for weight loss and continuing to improve his functional ability. Encouraged portion control.  2.  Hyperlipidemia: Continue Crestor. 3. Diarrhea: Chronic, having multiple flares a month. Sees GI 08/03/15. - Limiting uptitration of meds with concerns dehydration with lightheadedness   Increase spiro as above. Follow up in 3 months. BMET and Dig in 10-14 days.    Mariam Dollar Tillery PA-C. 9:41 AM   Patient seen with PA, agree with the above note.  Generally doing well, NYHA class II.  Med titration limited by hypotension but EF up to 40%.  I will increase spironolactone to 25 mg daily.  He will need BMET/BNP  and digoxin level in 10 days or so, followup 3 months.   Marca Ancona 07/24/2015

## 2015-07-24 NOTE — Patient Instructions (Signed)
Increase Spironolactone to 25mg  ( 1 tablet) every evening.   Lab work in 10- 14 days. (bmet)  Follow up in 3 months with Dr.McLean.

## 2015-07-24 NOTE — Progress Notes (Signed)
Advanced Heart Failure Medication Review by a Pharmacist  Does the patient  feel that his/her medications are working for him/her?  yes  Has the patient been experiencing any side effects to the medications prescribed?  no  Does the patient measure his/her own blood pressure or blood glucose at home?  yes   Does the patient have any problems obtaining medications due to transportation or finances?   no  Understanding of regimen: good Understanding of indications: good Potential of compliance: good Patient understands to avoid NSAIDs. Patient understands to avoid decongestants.  Issues to address at subsequent visits: None   Pharmacist comments:  [name] is a 65 yo M presenting with his medication bottles. He reports good compliance with his regimen but does state that he has only been taking his lasix QOD and 12.5 mg of spironolactone instead of 25 mg daily. He also reports that he is still having diarrhea for which he uses imodium and has an appointment with a gastroenterologist next week. No medication changes seem to have occurred around the time his diarrhea started.   [name]. Bonnye Fava, PharmD, BCPS, CPP Clinical Pharmacist Pager: [phone] Phone: [phone] 07/24/2015 9:51 AM      Time with patient: 10 minutes Preparation and documentation time: 2 minutes Total time: 12 minutes

## 2015-08-07 ENCOUNTER — Ambulatory Visit (HOSPITAL_COMMUNITY)
Admission: RE | Admit: 2015-08-07 | Discharge: 2015-08-07 | Disposition: A | Discharge status: Home / Self Care | Payer: BLUE CROSS/BLUE SHIELD | Source: Ambulatory Visit | Attending: Cardiology | Admitting: Cardiology

## 2015-08-07 DIAGNOSIS — I5041 Acute combined systolic (congestive) and diastolic (congestive) heart failure: Secondary | ICD-10-CM | POA: Insufficient documentation

## 2015-08-07 LAB — BASIC METABOLIC PANEL
ANION GAP: 11 (ref 5–15)
BUN: 17 mg/dL (ref 6–20)
CHLORIDE: 103 mmol/L (ref 101–111)
CO2: 24 mmol/L (ref 22–32)
Calcium: 9.2 mg/dL (ref 8.9–10.3)
Creatinine, Ser: 1.16 mg/dL (ref 0.61–1.24)
GFR calc Af Amer: >60 mL/min (ref >60)
GFR calc non Af Amer: >60 mL/min (ref >60)
Glucose, Bld: 172 mg/dL — ABNORMAL HIGH (ref 65–99)
POTASSIUM: 5.1 mmol/L (ref 3.5–5.1)
SODIUM: 138 mmol/L (ref 135–145)

## 2015-08-07 LAB — DIGOXIN LEVEL: DIGOXIN LVL: 0.4 ng/mL — AB (ref 0.8–2.0)

## 2015-08-11 ENCOUNTER — Other Ambulatory Visit (HOSPITAL_COMMUNITY): Payer: Self-pay | Admitting: Internal Medicine

## 2015-10-04 ENCOUNTER — Other Ambulatory Visit (HOSPITAL_COMMUNITY): Payer: Self-pay | Admitting: Adult Health

## 2015-10-12 DIAGNOSIS — E1165 Type 2 diabetes mellitus with hyperglycemia: Secondary | ICD-10-CM | POA: Diagnosis not present

## 2015-10-12 DIAGNOSIS — I509 Heart failure, unspecified: Secondary | ICD-10-CM | POA: Diagnosis not present

## 2015-10-12 DIAGNOSIS — J449 Chronic obstructive pulmonary disease, unspecified: Secondary | ICD-10-CM | POA: Diagnosis not present

## 2015-10-12 DIAGNOSIS — E782 Mixed hyperlipidemia: Secondary | ICD-10-CM | POA: Diagnosis not present

## 2015-10-31 ENCOUNTER — Encounter (HOSPITAL_COMMUNITY): Payer: Self-pay

## 2015-10-31 ENCOUNTER — Ambulatory Visit (HOSPITAL_COMMUNITY)
Admission: RE | Admit: 2015-10-31 | Discharge: 2015-10-31 | Disposition: A | Discharge status: Home / Self Care | Payer: BLUE CROSS/BLUE SHIELD | Source: Ambulatory Visit | Attending: Cardiology | Admitting: Cardiology

## 2015-10-31 VITALS — BP 124/78 | HR 75 | Wt 217.8 lb

## 2015-10-31 DIAGNOSIS — J449 Chronic obstructive pulmonary disease, unspecified: Secondary | ICD-10-CM | POA: Diagnosis not present

## 2015-10-31 DIAGNOSIS — Z79899 Other long term (current) drug therapy: Secondary | ICD-10-CM | POA: Insufficient documentation

## 2015-10-31 DIAGNOSIS — E119 Type 2 diabetes mellitus without complications: Secondary | ICD-10-CM | POA: Insufficient documentation

## 2015-10-31 DIAGNOSIS — Z7984 Long term (current) use of oral hypoglycemic drugs: Secondary | ICD-10-CM | POA: Diagnosis not present

## 2015-10-31 DIAGNOSIS — Z9981 Dependence on supplemental oxygen: Secondary | ICD-10-CM | POA: Insufficient documentation

## 2015-10-31 DIAGNOSIS — I428 Other cardiomyopathies: Secondary | ICD-10-CM | POA: Diagnosis not present

## 2015-10-31 DIAGNOSIS — E785 Hyperlipidemia, unspecified: Secondary | ICD-10-CM | POA: Insufficient documentation

## 2015-10-31 DIAGNOSIS — I5022 Chronic systolic (congestive) heart failure: Secondary | ICD-10-CM | POA: Diagnosis not present

## 2015-10-31 DIAGNOSIS — Z7982 Long term (current) use of aspirin: Secondary | ICD-10-CM | POA: Insufficient documentation

## 2015-10-31 DIAGNOSIS — Z87891 Personal history of nicotine dependence: Secondary | ICD-10-CM | POA: Insufficient documentation

## 2015-10-31 DIAGNOSIS — Z833 Family history of diabetes mellitus: Secondary | ICD-10-CM | POA: Insufficient documentation

## 2015-10-31 LAB — BASIC METABOLIC PANEL
ANION GAP: 16 — AB (ref 5–15)
BUN: 20 mg/dL (ref 6–20)
CHLORIDE: 94 mmol/L — AB (ref 101–111)
CO2: 24 mmol/L (ref 22–32)
Calcium: 10 mg/dL (ref 8.9–10.3)
Creatinine, Ser: 1.24 mg/dL (ref 0.61–1.24)
GFR calc non Af Amer: 59 mL/min — ABNORMAL LOW (ref >60)
Glucose, Bld: 190 mg/dL — ABNORMAL HIGH (ref 65–99)
POTASSIUM: 4.6 mmol/L (ref 3.5–5.1)
SODIUM: 134 mmol/L — AB (ref 135–145)

## 2015-10-31 LAB — DIGOXIN LEVEL: Digoxin Level: 0.6 ng/mL — ABNORMAL LOW (ref 0.8–2.0)

## 2015-10-31 MED ORDER — LISINOPRIL 5 MG PO TABS: 5.0000 mg | ORAL_TABLET | Freq: Two times a day (BID) | ORAL | Status: DC

## 2015-10-31 NOTE — Patient Instructions (Signed)
Routine lab work today. Will notify you of abnormal results, otherwise no news is good news!  INCREASE Lisinopril to 5 mg twice daily.  Return in 2 weeks for repeat labs.  Follow up 3 months with Dr. Shirlee Latch.  Do the following things EVERYDAY: 1) Weigh yourself in the morning before breakfast. Write it down and keep it in a log. 2) Take your medicines as prescribed 3) Eat low salt foods-Limit salt (sodium) to 2000 mg per day.  4) Stay as active as you can everyday 5) Limit all fluids for the day to less than 2 liters

## 2015-10-31 NOTE — Progress Notes (Signed)
Patient ID: [name], male   DOB: 1950/12/14, 65 y.o.   MRN: [id number]   Advanced Heart Failure Clinic Note   PCP: Dr. Kirtland Bouchard. [facility]: Dr. Shirlee Latch  HPI: [name] is a 65 year old with history of NICM, chronic systolic heart failure,COPD on home oxygen, and DM II was admitted in 1/16 with chest pain and volume overload. Had an ECHO that showed EF 20-25%. Underwent cath as noted below. He was discharged on low dose bb, ace, dig, lasix, and spiro.  Cardiac MRI in 8/16 showed EF remaining 31%.    He has been doing well generally.  No dyspnea walking on flat ground.  Mows and weedeats 7 acre yard.  No chest pain.  Able to do all his activities without problems.  Sometimes, his BP will run low (SBP in 90s).  When this happens, he will get lightheaded.  Weight is stable.  No palpitations.     CPX 11/08/2014 Peak VO2: 17.8 (70.8% predicted peak VO2)  VE/VCO2 slope: 25.3  OUES: 2.27 Peak RER:  1.00  RHC/LHC 07/18/2014 RA: 5 RV: 29/8/9 PA: 30/11 (21) PCWP: 16 Fick CO/CI: 6.0/3.0 PVR: 0.8 WU SVR: 1042 Ao sat: 94% PA sats: 73%, 74% SVC sat: 72% Large vessel. Wraps apex. Small D1. Large D2. 20-30% plaque in proximal and distal LAD and D2  - ECHO 07/15/14 EF 20-25% Grade III DD - ECHO 10/18/2014 EF 25-30%, diffuse hypokinesis, mildly decreased RV systolic function with normal RV size.  - Cardiac MRI (8/16) with EF 31%, moderate LVH, normal RV size with mildly decreased systolic function, LGE at the RV insertion sites (nonspecific, suggestive of volume overload).  - Echo 05/01/15 EF 40% with diffuse hypokinesis.   Labs 07/13/2014 BNP 877 Labs 07/19/2014 K 4.5 Creatinine 1.12 Labs 07/25/2014 Dig level 1.7./   Labs 08/16/2014 K 4.5 Creatinine 1.07  Labs 09/22/2014: K 3.8 creatinine 1.14 Dig level 0.2  Labs 10/26/2014: K 4.2 Creatinine 1.09   Labs 6/16: K 4.6, creatinine 1.26 Labs 12/12/14: K 4.3 Creatinine 1.09 Labs 9/16: K 4.1, Na 129, creatinine 1.09, digoxin 0.3 Labs 10/16: K 4.7,  creatinine 1.02, LDL 112, AST 47, ALT 65 Labs 1/17: K 4.5, creatinine 1.17, LDL 29, HDL 23 Labs 2/17: K 5.1, creatinine 1.16, digoxin 0.4  Social History: He lives in [place] with his wife. Quit smoking many years ago. No alcohol use. No illicit drug use. Usually independent with daily activities.  Family History: Diabetes.  No CHF or CAD.  ROS: All systems negative except as listed in HPI, PMH and Problem List.  SH:  Social History   Social History  . Marital Status: Married    Spouse Name: N/A  . Number of Children: N/A  . Years of Education: N/A   Occupational History  . Not on file.   Social History Main Topics  . Smoking status: Former Smoker    Start date: 10/24/2010  . Smokeless tobacco: Not on file  . Alcohol Use: Yes  . Drug Use: No  . Sexual Activity: Not on file   Other Topics Concern  . Not on file   Social History Narrative    Past Medical History  Diagnosis Date  . COPD (chronic obstructive pulmonary disease) (HCC)   . Obesity   . Diabetes mellitus, type II (HCC)   . Chronic systolic CHF (congestive heart failure) (HCC)     Current Outpatient Prescriptions  Medication Sig Dispense Refill  . albuterol (PROVENTIL HFA;VENTOLIN HFA) 108 (90 BASE) MCG/ACT inhaler  Inhale 1 puff into the lungs every 6 (six) hours as needed for wheezing or shortness of breath (sometimes allergy). Reported on 07/24/2015    . aspirin 81 MG EC tablet TAKE 1 TABLET (81 MG TOTAL) BY MOUTH DAILY. 30 tablet 3  . carvedilol (COREG) 12.5 MG tablet Take 1 tablet (12.5 mg total) by mouth 2 (two) times daily with a meal. 60 tablet 6  . DIGOX 125 MCG tablet TAKE 1/2 TABLET (62.5 MCG TOTAL) BY MOUTH DAILY. 90 tablet 3  . docusate sodium (COLACE) 100 MG capsule Take 100 mg by mouth 2 (two) times daily as needed for mild constipation. Reported on 10/31/2015    . furosemide (LASIX) 40 MG tablet Take 40 mg by mouth every other day.    Marland Kitchen glipiZIDE (GLUCOTROL) 10 MG tablet Take 10 mg by mouth 2  (two) times daily before a meal.    . ivabradine (CORLANOR) 5 MG TABS tablet Take 1 tablet (5 mg total) by mouth 2 (two) times daily with a meal. 180 tablet 3  . lisinopril (PRINIVIL,ZESTRIL) 5 MG tablet Take 1 tablet (5 mg total) by mouth 2 (two) times daily. 60 tablet 6  . loperamide (IMODIUM A-D) 2 MG tablet Take 2 mg by mouth 4 (four) times daily as needed for diarrhea or loose stools.    . metFORMIN (GLUCOPHAGE) 500 MG tablet Take 1,000 mg by mouth 2 (two) times daily with a meal.     . pantoprazole (PROTONIX) 40 MG tablet TAKE 1 TABLET (40 MG TOTAL) BY MOUTH DAILY. 30 tablet 4  . rosuvastatin (CRESTOR) 20 MG tablet Take 20 mg by mouth daily.    . simethicone (MYLICON) 80 MG chewable tablet Chew 1 tablet (80 mg total) by mouth every 6 (six) hours as needed for flatulence. 30 tablet 0  . sitaGLIPtin (JANUVIA) 100 MG tablet Take 1 tablet (100 mg total) by mouth daily. 30 tablet 3  . spironolactone (ALDACTONE) 25 MG tablet Take 1 tablet (25 mg total) by mouth every evening. 30 tablet 3   No current facility-administered medications for this encounter.    Filed Vitals:   10/31/15 0858  BP: 124/78  Pulse: 75  Weight: 217 lb 12 oz (98.771 kg)  SpO2: 97%   Wt Readings from Last 3 Encounters:  10/31/15 217 lb 12 oz (98.771 kg)  07/24/15 216 lb 4 oz (98.09 kg)  05/01/15 216 lb 12 oz (98.317 kg)     PHYSICAL EXAM: General:  Well appearing. NAD HEENT: normal Neck: supple. JVP 6-7 cm. Carotids 2+ bilaterally; no bruits. No thyromegaly or nodule noted. Cor: PMI normal. RRR. No M/G/R Lungs: Clear, normal effort Abdomen: obese, soft, non-tender, non-distended, no HSM. No bruits or masses. +BS Extremities: no cyanosis, clubbing, rash. No edema Neuro: alert & orientedx3, cranial nerves grossly intact. Moves all 4 extremities w/o difficulty. Affect pleasant.  ASSESSMENT & PLAN:  1. 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  Had cath 07/18/2014 without  significant coronary disease. ECHO 06/2014 EF 20-25% => ECHO 5/16 EF 25-30%.  Cardiac MRI in 8/16 with EF 31%, RV insertion site LGE pattern, nonspecific.  Echo in 11/16 showed some improvement in EF to 40%.  Currently out of range for ICD.  NYHA class II.  Volume status stable. - Continue Lasix 40 mg every other day.  - Continue spironolactone 25 mg daily.   - Continue Coreg 12.5 mg bid  - Increase lisinopril to 5 mg bid. BMET today and repeat in 2 wks.  -  Continue digoxin 0.0625 mg daily, check level.  - Continue Corlanor 5 mg twice a day.    - Continue sliding scale diuretics. Can take daily for weight gains of 3 lbs overnight or 5 lbs within a week. Can hold if lightheaded/dizzy, feels dry.  - Reinforced daily weights, low salt food choices, and limiting fluid to less than 2 liters per day.   2.  Hyperlipidemia: Continue Crestor.  Marca Ancona 10/31/2015

## 2015-11-15 ENCOUNTER — Other Ambulatory Visit (HOSPITAL_COMMUNITY): Payer: Self-pay | Admitting: Cardiology

## 2015-11-17 ENCOUNTER — Other Ambulatory Visit (HOSPITAL_COMMUNITY): Payer: BLUE CROSS/BLUE SHIELD

## 2015-11-20 ENCOUNTER — Ambulatory Visit (HOSPITAL_COMMUNITY)
Admission: RE | Admit: 2015-11-20 | Discharge: 2015-11-20 | Disposition: A | Discharge status: Home / Self Care | Payer: BLUE CROSS/BLUE SHIELD | Source: Ambulatory Visit | Attending: Internal Medicine | Admitting: Internal Medicine

## 2015-11-20 DIAGNOSIS — I5022 Chronic systolic (congestive) heart failure: Secondary | ICD-10-CM | POA: Diagnosis not present

## 2015-11-20 LAB — HEPATIC FUNCTION PANEL
ALBUMIN: 4.3 g/dL (ref 3.5–5.0)
ALT: 44 U/L (ref 17–63)
AST: 43 U/L — ABNORMAL HIGH (ref 15–41)
Alkaline Phosphatase: 36 U/L — ABNORMAL LOW (ref 38–126)
BILIRUBIN DIRECT: 0.2 mg/dL (ref 0.1–0.5)
Indirect Bilirubin: 1 mg/dL — ABNORMAL HIGH (ref 0.3–0.9)
TOTAL PROTEIN: 7.6 g/dL (ref 6.5–8.1)
Total Bilirubin: 1.2 mg/dL (ref 0.3–1.2)

## 2015-11-20 LAB — BASIC METABOLIC PANEL
ANION GAP: 9 (ref 5–15)
BUN: 40 mg/dL — ABNORMAL HIGH (ref 6–20)
CALCIUM: 9.7 mg/dL (ref 8.9–10.3)
CO2: 21 mmol/L — AB (ref 22–32)
Chloride: 102 mmol/L (ref 101–111)
Creatinine, Ser: 1.45 mg/dL — ABNORMAL HIGH (ref 0.61–1.24)
GFR, EST AFRICAN AMERICAN: 57 mL/min — AB (ref >60)
GFR, EST NON AFRICAN AMERICAN: 49 mL/min — AB (ref >60)
Glucose, Bld: 226 mg/dL — ABNORMAL HIGH (ref 65–99)
Potassium: 5 mmol/L (ref 3.5–5.1)
Sodium: 132 mmol/L — ABNORMAL LOW (ref 135–145)

## 2015-11-20 NOTE — Addendum Note (Signed)
Encounter addended by: Theresia Bough, CMA on: 11/20/2015 11:42 AM<BR>     Documentation filed: [employer], Orders

## 2015-11-21 ENCOUNTER — Telehealth (HOSPITAL_COMMUNITY): Payer: Self-pay | Admitting: *Deleted

## 2015-11-21 DIAGNOSIS — I5022 Chronic systolic (congestive) heart failure: Secondary | ICD-10-CM

## 2015-11-21 MED ORDER — FUROSEMIDE 40 MG PO TABS: 20.0000 mg | ORAL_TABLET | ORAL | Status: DC

## 2015-11-21 NOTE — Telephone Encounter (Signed)
-----   Message from Laurey Morale, MD sent at 11/20/2015  3:56 PM EDT ----- Decrease Lasix to 20 mg every other day.  Cut back on K in diet.  Repeat BMET 1 week.

## 2015-11-21 NOTE — Telephone Encounter (Signed)
Notes Recorded by Noralee Space, RN on 11/21/2015 at 3:35 PM Pt's wife aware and verbalizes understanding, repeat labs sch 6/16

## 2015-11-22 ENCOUNTER — Other Ambulatory Visit (HOSPITAL_COMMUNITY): Payer: Self-pay | Admitting: Cardiology

## 2015-12-01 ENCOUNTER — Ambulatory Visit (HOSPITAL_COMMUNITY)
Admission: RE | Admit: 2015-12-01 | Discharge: 2015-12-01 | Disposition: A | Discharge status: Home / Self Care | Payer: BLUE CROSS/BLUE SHIELD | Source: Ambulatory Visit | Attending: Internal Medicine | Admitting: Internal Medicine

## 2015-12-01 DIAGNOSIS — I5022 Chronic systolic (congestive) heart failure: Secondary | ICD-10-CM

## 2015-12-01 LAB — BASIC METABOLIC PANEL
ANION GAP: 13 (ref 5–15)
BUN: 23 mg/dL — AB (ref 6–20)
CO2: 22 mmol/L (ref 22–32)
Calcium: 9.3 mg/dL (ref 8.9–10.3)
Chloride: 98 mmol/L — ABNORMAL LOW (ref 101–111)
Creatinine, Ser: 1.35 mg/dL — ABNORMAL HIGH (ref 0.61–1.24)
GFR calc Af Amer: >60 mL/min (ref >60)
GFR, EST NON AFRICAN AMERICAN: 54 mL/min — AB (ref >60)
Glucose, Bld: 172 mg/dL — ABNORMAL HIGH (ref 65–99)
POTASSIUM: 4.8 mmol/L (ref 3.5–5.1)
SODIUM: 133 mmol/L — AB (ref 135–145)

## 2015-12-15 DIAGNOSIS — L821 Other seborrheic keratosis: Secondary | ICD-10-CM | POA: Diagnosis not present

## 2015-12-15 DIAGNOSIS — L570 Actinic keratosis: Secondary | ICD-10-CM | POA: Diagnosis not present

## 2015-12-15 DIAGNOSIS — D225 Melanocytic nevi of trunk: Secondary | ICD-10-CM | POA: Diagnosis not present

## 2016-01-12 IMAGING — NM NM HEPATOBILIARY IMAGE, INC GB
2 series · 12 of 12 positions shown · non-contrast
Comparison: Abdominal ultrasound dated July 13, 2014

CLINICAL DATA: Bloating and abdominal pressure sensation, suspect
mild cholecystitis

EXAM:
NUCLEAR MEDICINE HEPATOBILIARY IMAGING WITH GALLBLADDER EF
TECHNIQUE: Sequential images of the abdomen were obtained [DATE] minutes
following intravenous administration of radiopharmaceutical. After
slow intravenous infusion of 1.9 micrograms Cholecystokinin,
gallbladder ejection fraction was determined.
RADIOPHARMACEUTICALS:  1.0 Millicurie Lc-XXm Choletec

[he hepatobiliary · 3.43mm/px · 6 of 60 frames shown (1 of 2)]
[frame 6/60]
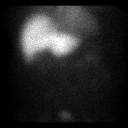
[frame 16/60]
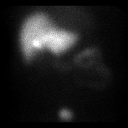
[frame 26/60]
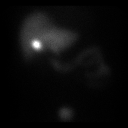
[frame 36/60]
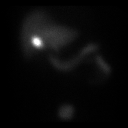
[frame 46/60]
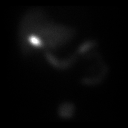
[frame 56/60]
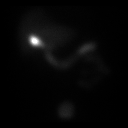

[he hepatobiliary · 3.43mm/px · 6 of 30 frames shown (2 of 2)]
[frame 3/30]
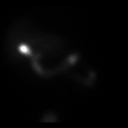
[frame 8/30]
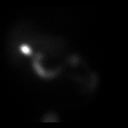
[frame 13/30]
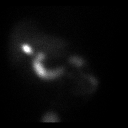
[frame 18/30]
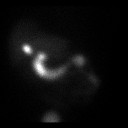
[frame 23/30]
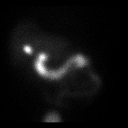
[frame 28/30]
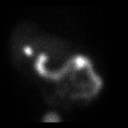

[12 of 12 positions shown; findings below may reference images not displayed]

FINDINGS: There is adequate uptake of the radiopharmaceutical by the liver.
The gallbladder is visualized by 20 min. Bowel activity is also
evident by 15-20 min. The 30 min gallbladder ejection fraction is
79%. At 30 min, normal ejection fraction is greater than 30%.

The patient did not experience symptoms during CCK infusion.
IMPRESSION: Normal hepatobiliary scan and normal gallbladder ejection fraction.

## 2016-01-22 ENCOUNTER — Other Ambulatory Visit (HOSPITAL_COMMUNITY): Payer: Self-pay

## 2016-01-22 ENCOUNTER — Telehealth (HOSPITAL_COMMUNITY): Payer: Self-pay

## 2016-01-22 MED ORDER — IVABRADINE HCL 5 MG PO TABS: 5.0000 mg | ORAL_TABLET | Freq: Two times a day (BID) | ORAL | 3 refills | Status: DC

## 2016-01-22 NOTE — Telephone Encounter (Signed)
Patient's family member calling CHF clinic triage line to request new Rx refill for Corlanor to be sent in on behalf of patient. 90 day supply with 3 refills for one year Rx sent to [facility] as requested.  Ave Filter RN

## 2016-01-25 ENCOUNTER — Telehealth (HOSPITAL_COMMUNITY): Payer: Self-pay | Admitting: Vascular Surgery

## 2016-01-25 ENCOUNTER — Other Ambulatory Visit (HOSPITAL_COMMUNITY): Payer: Self-pay | Admitting: Internal Medicine

## 2016-01-25 NOTE — Telephone Encounter (Signed)
pt needs a refill for meds, there is a refill request sent to to [facility].. Please advise

## 2016-01-25 NOTE — Telephone Encounter (Signed)
Encounter open in error 

## 2016-02-08 ENCOUNTER — Other Ambulatory Visit (HOSPITAL_COMMUNITY): Payer: Self-pay | Admitting: Internal Medicine

## 2016-02-26 ENCOUNTER — Ambulatory Visit (HOSPITAL_COMMUNITY)
Admission: RE | Admit: 2016-02-26 | Discharge: 2016-02-26 | Disposition: A | Discharge status: Home / Self Care | Payer: BLUE CROSS/BLUE SHIELD | Source: Ambulatory Visit | Attending: Cardiology | Admitting: Cardiology

## 2016-02-26 ENCOUNTER — Encounter (HOSPITAL_COMMUNITY): Payer: Self-pay

## 2016-02-26 DIAGNOSIS — I428 Other cardiomyopathies: Secondary | ICD-10-CM | POA: Diagnosis not present

## 2016-02-26 DIAGNOSIS — Z87891 Personal history of nicotine dependence: Secondary | ICD-10-CM | POA: Insufficient documentation

## 2016-02-26 DIAGNOSIS — Z79899 Other long term (current) drug therapy: Secondary | ICD-10-CM | POA: Insufficient documentation

## 2016-02-26 DIAGNOSIS — Z7982 Long term (current) use of aspirin: Secondary | ICD-10-CM | POA: Insufficient documentation

## 2016-02-26 DIAGNOSIS — I5022 Chronic systolic (congestive) heart failure: Secondary | ICD-10-CM | POA: Insufficient documentation

## 2016-02-26 DIAGNOSIS — Z7984 Long term (current) use of oral hypoglycemic drugs: Secondary | ICD-10-CM | POA: Insufficient documentation

## 2016-02-26 DIAGNOSIS — Z833 Family history of diabetes mellitus: Secondary | ICD-10-CM | POA: Diagnosis not present

## 2016-02-26 DIAGNOSIS — J449 Chronic obstructive pulmonary disease, unspecified: Secondary | ICD-10-CM | POA: Insufficient documentation

## 2016-02-26 DIAGNOSIS — E119 Type 2 diabetes mellitus without complications: Secondary | ICD-10-CM | POA: Diagnosis not present

## 2016-02-26 DIAGNOSIS — Z6834 Body mass index (BMI) 34.0-34.9, adult: Secondary | ICD-10-CM | POA: Insufficient documentation

## 2016-02-26 DIAGNOSIS — E669 Obesity, unspecified: Secondary | ICD-10-CM | POA: Insufficient documentation

## 2016-02-26 DIAGNOSIS — E785 Hyperlipidemia, unspecified: Secondary | ICD-10-CM | POA: Diagnosis not present

## 2016-02-26 DIAGNOSIS — Z9981 Dependence on supplemental oxygen: Secondary | ICD-10-CM | POA: Diagnosis not present

## 2016-02-26 LAB — DIGOXIN LEVEL: DIGOXIN LVL: 0.4 ng/mL — AB (ref 0.8–2.0)

## 2016-02-26 LAB — BASIC METABOLIC PANEL
ANION GAP: 15 (ref 5–15)
BUN: 21 mg/dL — AB (ref 6–20)
CALCIUM: 9.7 mg/dL (ref 8.9–10.3)
CO2: 24 mmol/L (ref 22–32)
Chloride: 96 mmol/L — ABNORMAL LOW (ref 101–111)
Creatinine, Ser: 1.22 mg/dL (ref 0.61–1.24)
GFR calc Af Amer: >60 mL/min (ref >60)
Glucose, Bld: 246 mg/dL — ABNORMAL HIGH (ref 65–99)
POTASSIUM: 4.9 mmol/L (ref 3.5–5.1)
Sodium: 135 mmol/L (ref 135–145)

## 2016-02-26 MED ORDER — LISINOPRIL 5 MG PO TABS: ORAL_TABLET | ORAL | 6 refills | Status: DC

## 2016-02-26 NOTE — Patient Instructions (Signed)
Increase Lisinopril to 5 mg (1 tab) in AM and 7.5 mg (1 & 1/2 tabs) in PM  Labs today  Labs in 2 weeks  We will contact you in 3 months to schedule your next appointment.

## 2016-02-27 NOTE — Progress Notes (Signed)
Patient ID: [name], male   DOB: 05/02/1951, 65 y.o.   MRN: [id number]   Advanced Heart Failure Clinic Note   PCP: Dr. Kirtland Bouchard. [facility]: Dr. Shirlee Latch  HPI: [name] is a 65 year old with history of NICM, chronic systolic heart failure,COPD on home oxygen, and DM II was admitted in 1/16 with chest pain and volume overload. Had an ECHO that showed EF 20-25%. Underwent cath as noted below. He was discharged on low dose bb, ace, dig, lasix, and spiro.  Cardiac MRI in 8/16 showed EF remaining 31%.    He has been doing well generally.  No dyspnea walking on flat ground.  Mows and weedeats 7 acre yard.  No chest pain.  Main limitation is from pain in his right ankle => prior fracture never healed completely.  Sometimes, his BP will run low (SBP in 90s).  When this happens, he will get lightheaded.  Weight is up 2 lbs.  No palpitations.     CPX 11/08/2014 Peak VO2: 17.8 (70.8% predicted peak VO2)  VE/VCO2 slope: 25.3  OUES: 2.27 Peak RER:  1.00  RHC/LHC 07/18/2014 RA: 5 RV: 29/8/9 PA: 30/11 (21) PCWP: 16 Fick CO/CI: 6.0/3.0 PVR: 0.8 WU SVR: 1042 Ao sat: 94% PA sats: 73%, 74% SVC sat: 72% Large vessel. Wraps apex. Small D1. Large D2. 20-30% plaque in proximal and distal LAD and D2  - ECHO 07/15/14 EF 20-25% Grade III DD - ECHO 10/18/2014 EF 25-30%, diffuse hypokinesis, mildly decreased RV systolic function with normal RV size.  - Cardiac MRI (8/16) with EF 31%, moderate LVH, normal RV size with mildly decreased systolic function, LGE at the RV insertion sites (nonspecific, suggestive of volume overload).  - Echo 05/01/15 EF 40% with diffuse hypokinesis.   Labs 07/13/2014 BNP 877 Labs 07/19/2014 K 4.5 Creatinine 1.12 Labs 07/25/2014 Dig level 1.7./   Labs 08/16/2014 K 4.5 Creatinine 1.07  Labs 09/22/2014: K 3.8 creatinine 1.14 Dig level 0.2  Labs 10/26/2014: K 4.2 Creatinine 1.09   Labs 6/16: K 4.6, creatinine 1.26 Labs 12/12/14: K 4.3 Creatinine 1.09 Labs 9/16: K 4.1, Na 129, creatinine  1.09, digoxin 0.3 Labs 10/16: K 4.7, creatinine 1.02, LDL 112, AST 47, ALT 65 Labs 1/17: K 4.5, creatinine 1.17, LDL 29, HDL 23 Labs 2/17: K 5.1, creatinine 1.16, digoxin 0.4 Labs 5/17: digoxin 0.6 Labs 6/17: K 4.8, creatinine 1.35, AST 43, ALT 44  Social History: He lives in [name] with his wife. Quit smoking many years ago. No alcohol use. No illicit drug use. Usually independent with daily activities.  Family History: Diabetes.  No CHF or CAD.  ROS: All systems negative except as listed in HPI, PMH and Problem List.  SH:  Social History   Social History  . Marital status: Married    Spouse name: N/A  . Number of children: N/A  . Years of education: N/A   Occupational History  . Not on file.   Social History Main Topics  . Smoking status: Former Smoker    Start date: 10/24/2010  . Smokeless tobacco: Not on file  . Alcohol use Yes  . Drug use: No  . Sexual activity: Not on file   Other Topics Concern  . Not on file   Social History Narrative  . No narrative on file    Past Medical History:  Diagnosis Date  . Chronic systolic CHF (congestive heart failure) (HCC)   . COPD (chronic obstructive pulmonary disease) (HCC)   . Diabetes mellitus, type  II (HCC)   . Obesity     Current Outpatient Prescriptions  Medication Sig Dispense Refill  . aspirin 81 MG EC tablet TAKE 1 TABLET (81 MG TOTAL) BY MOUTH DAILY. 30 tablet 5  . carvedilol (COREG) 12.5 MG tablet TAKE 1 TABLET BY MOUTH 2 TIMES DAILY WITH A MEAL. 60 tablet 6  . CORLANOR 5 MG TABS tablet TAKE 1 BY MOUTH TWICE DAILY WITH A MEAL 180 tablet 3  . DIGOX 125 MCG tablet TAKE 1/2 TABLET (62.5 MCG TOTAL) BY MOUTH DAILY. 90 tablet 3  . docusate sodium (COLACE) 100 MG capsule Take 100 mg by mouth 2 (two) times daily as needed for mild constipation. Reported on 10/31/2015    . furosemide (LASIX) 40 MG tablet Take 40 mg by mouth every other day.    Marland Kitchen glipiZIDE (GLUCOTROL) 10 MG tablet Take 10 mg by mouth 2 (two) times daily  before a meal.    . lisinopril (PRINIVIL,ZESTRIL) 5 MG tablet Take 1 tab in AM and 1.5 tabs in PM 75 tablet 6  . metFORMIN (GLUCOPHAGE) 500 MG tablet Take 1,000 mg by mouth 2 (two) times daily with a meal.     . pantoprazole (PROTONIX) 40 MG tablet TAKE 1 TABLET (40 MG TOTAL) BY MOUTH DAILY. 30 tablet 5  . rosuvastatin (CRESTOR) 20 MG tablet Take 20 mg by mouth daily.    . simethicone (MYLICON) 80 MG chewable tablet Chew 1 tablet (80 mg total) by mouth every 6 (six) hours as needed for flatulence. 30 tablet 0  . sitaGLIPtin (JANUVIA) 100 MG tablet Take 1 tablet (100 mg total) by mouth daily. 30 tablet 3  . spironolactone (ALDACTONE) 25 MG tablet TAKE 1 TABLET (25 MG TOTAL) BY MOUTH EVERY EVENING. 30 tablet 3  . albuterol (PROVENTIL HFA;VENTOLIN HFA) 108 (90 BASE) MCG/ACT inhaler Inhale 1 puff into the lungs every 6 (six) hours as needed for wheezing or shortness of breath (sometimes allergy). Reported on 07/24/2015    . loperamide (IMODIUM A-D) 2 MG tablet Take 2 mg by mouth 4 (four) times daily as needed for diarrhea or loose stools.     No current facility-administered medications for this encounter.     Vitals:   02/26/16 0925  BP: 108/62  Pulse: 80  SpO2: 97%  Weight: 219 lb (99.3 kg)   Wt Readings from Last 3 Encounters:  02/26/16 219 lb (99.3 kg)  10/31/15 217 lb 12 oz (98.8 kg)  07/24/15 216 lb 4 oz (98.1 kg)     PHYSICAL EXAM: General:  Well appearing. NAD HEENT: normal Neck: supple. JVP 6-7 cm. Carotids 2+ bilaterally; no bruits. No thyromegaly or nodule noted. Cor: PMI normal. RRR. No M/G/R Lungs: Clear, normal effort Abdomen: obese, soft, non-tender, non-distended, no HSM. No bruits or masses. +BS Extremities: no cyanosis, clubbing, rash. No edema Neuro: alert & orientedx3, cranial nerves grossly intact. Moves all 4 extremities w/o difficulty. Affect pleasant.  ASSESSMENT & PLAN:  1. 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   Had cath 07/18/2014 without significant coronary disease. ECHO 06/2014 EF 20-25% => ECHO 5/16 EF 25-30%.  Cardiac MRI in 8/16 with EF 31%, RV insertion site LGE pattern, nonspecific.  Echo in 11/16 showed some improvement in EF to 40%.  Currently out of range for ICD.  NYHA class II.  Volume status stable. - Continue Lasix 40 mg every other day.  - Continue spironolactone 25 mg daily.   - Continue Coreg 12.5 mg bid  - Increase  lisinopril to 5 mg qam, 7.5 mg qpm (slow increase given orthostatic symptoms when BP gets too low). BMET today and repeat in 2 wks.  - Continue digoxin 0.0625 mg daily, check level.  - Continue Corlanor 5 mg twice a day.    - Continue sliding scale diuretics. Can take daily for weight gains of 3 lbs overnight or 5 lbs within a week. Can hold if lightheaded/dizzy, feels dry.  - Reinforced daily weights, low salt food choices, and limiting fluid to less than 2 liters per day.   2.  Hyperlipidemia: Continue Crestor.  Followup in 3 months.   Marca AnconaDalton Yanai Hobson 02/27/2016

## 2016-03-11 ENCOUNTER — Ambulatory Visit (HOSPITAL_COMMUNITY)
Admission: RE | Admit: 2016-03-11 | Discharge: 2016-03-11 | Disposition: A | Discharge status: Home / Self Care | Payer: BLUE CROSS/BLUE SHIELD | Source: Ambulatory Visit | Attending: Internal Medicine | Admitting: Internal Medicine

## 2016-03-11 DIAGNOSIS — I5022 Chronic systolic (congestive) heart failure: Secondary | ICD-10-CM | POA: Diagnosis not present

## 2016-03-11 LAB — BASIC METABOLIC PANEL
ANION GAP: 8 (ref 5–15)
BUN: 22 mg/dL — AB (ref 6–20)
CALCIUM: 9.5 mg/dL (ref 8.9–10.3)
CO2: 26 mmol/L (ref 22–32)
CREATININE: 1.08 mg/dL (ref 0.61–1.24)
Chloride: 101 mmol/L (ref 101–111)
GFR calc Af Amer: >60 mL/min (ref >60)
GLUCOSE: 256 mg/dL — AB (ref 65–99)
Potassium: 4.9 mmol/L (ref 3.5–5.1)
Sodium: 135 mmol/L (ref 135–145)

## 2016-04-10 DIAGNOSIS — E782 Mixed hyperlipidemia: Secondary | ICD-10-CM | POA: Diagnosis not present

## 2016-04-10 DIAGNOSIS — Z23 Encounter for immunization: Secondary | ICD-10-CM | POA: Diagnosis not present

## 2016-04-10 DIAGNOSIS — E1165 Type 2 diabetes mellitus with hyperglycemia: Secondary | ICD-10-CM | POA: Diagnosis not present

## 2016-04-10 DIAGNOSIS — I429 Cardiomyopathy, unspecified: Secondary | ICD-10-CM | POA: Diagnosis not present

## 2016-04-10 DIAGNOSIS — J449 Chronic obstructive pulmonary disease, unspecified: Secondary | ICD-10-CM | POA: Diagnosis not present

## 2016-04-25 ENCOUNTER — Other Ambulatory Visit (HOSPITAL_COMMUNITY): Payer: Self-pay | Admitting: Cardiology

## 2016-05-01 DIAGNOSIS — R945 Abnormal results of liver function studies: Secondary | ICD-10-CM | POA: Diagnosis not present

## 2016-05-27 ENCOUNTER — Ambulatory Visit (HOSPITAL_COMMUNITY)
Admission: RE | Admit: 2016-05-27 | Discharge: 2016-05-27 | Disposition: A | Discharge status: Home / Self Care | Payer: BLUE CROSS/BLUE SHIELD | Source: Ambulatory Visit | Attending: Cardiology | Admitting: Cardiology

## 2016-05-27 DIAGNOSIS — I5022 Chronic systolic (congestive) heart failure: Secondary | ICD-10-CM | POA: Insufficient documentation

## 2016-05-27 DIAGNOSIS — Z87891 Personal history of nicotine dependence: Secondary | ICD-10-CM | POA: Diagnosis not present

## 2016-05-27 DIAGNOSIS — Z9981 Dependence on supplemental oxygen: Secondary | ICD-10-CM | POA: Diagnosis not present

## 2016-05-27 DIAGNOSIS — I429 Cardiomyopathy, unspecified: Secondary | ICD-10-CM | POA: Insufficient documentation

## 2016-05-27 DIAGNOSIS — Z7982 Long term (current) use of aspirin: Secondary | ICD-10-CM | POA: Diagnosis not present

## 2016-05-27 DIAGNOSIS — J449 Chronic obstructive pulmonary disease, unspecified: Secondary | ICD-10-CM | POA: Diagnosis not present

## 2016-05-27 DIAGNOSIS — M25571 Pain in right ankle and joints of right foot: Secondary | ICD-10-CM | POA: Diagnosis not present

## 2016-05-27 DIAGNOSIS — Z7984 Long term (current) use of oral hypoglycemic drugs: Secondary | ICD-10-CM | POA: Diagnosis not present

## 2016-05-27 DIAGNOSIS — E669 Obesity, unspecified: Secondary | ICD-10-CM | POA: Insufficient documentation

## 2016-05-27 DIAGNOSIS — E119 Type 2 diabetes mellitus without complications: Secondary | ICD-10-CM | POA: Diagnosis not present

## 2016-05-27 DIAGNOSIS — E785 Hyperlipidemia, unspecified: Secondary | ICD-10-CM | POA: Insufficient documentation

## 2016-05-27 DIAGNOSIS — R079 Chest pain, unspecified: Secondary | ICD-10-CM | POA: Diagnosis not present

## 2016-05-27 LAB — LIPID PANEL
Cholesterol: 103 mg/dL (ref 0–200)
HDL: 27 mg/dL — AB (ref >40)
LDL CALC: 37 mg/dL (ref 0–99)
TRIGLYCERIDES: 196 mg/dL — AB (ref <150)
Total CHOL/HDL Ratio: 3.8 RATIO
VLDL: 39 mg/dL (ref 0–40)

## 2016-05-27 LAB — BASIC METABOLIC PANEL
Anion gap: 11 (ref 5–15)
BUN: 16 mg/dL (ref 6–20)
CALCIUM: 9.5 mg/dL (ref 8.9–10.3)
CO2: 26 mmol/L (ref 22–32)
Chloride: 99 mmol/L — ABNORMAL LOW (ref 101–111)
Creatinine, Ser: 1.09 mg/dL (ref 0.61–1.24)
GFR calc Af Amer: >60 mL/min (ref >60)
GLUCOSE: 168 mg/dL — AB (ref 65–99)
Potassium: 4.6 mmol/L (ref 3.5–5.1)
Sodium: 136 mmol/L (ref 135–145)

## 2016-05-27 LAB — DIGOXIN LEVEL: Digoxin Level: 0.4 ng/mL — ABNORMAL LOW (ref 0.8–2.0)

## 2016-05-27 MED ORDER — LISINOPRIL 5 MG PO TABS: 7.5000 mg | ORAL_TABLET | Freq: Two times a day (BID) | ORAL | 6 refills | Status: DC

## 2016-05-27 NOTE — Patient Instructions (Signed)
INCREASE Lisinopril to 7/5 mg (1.5 tabs) twice daily (once every 12 hours).  Routine lab work today. Will notify you of abnormal results, otherwise no news is good news!  Will schedule you for an echocardiogram at [facility]. Address: [address] ([address] Floor), [place], [place] [address]  Phone: [phone]  Follow up 4 months with Dr. Shirlee Latch.  Do the following things EVERYDAY: 1) Weigh yourself in the morning before breakfast. Write it down and keep it in a log. 2) Take your medicines as prescribed 3) Eat low salt foods-Limit salt (sodium) to 2000 mg per day.  4) Stay as active as you can everyday 5) Limit all fluids for the day to less than 2 liters

## 2016-05-27 NOTE — Progress Notes (Signed)
Patient ID: [name], male   DOB: 12-Mar-1951, 65 y.o.   MRN: [id number]   Advanced Heart Failure Clinic Note   PCP: Dr. Kirtland Bouchard. [facility]: Dr. Shirlee Latch  HPI: [name] is a 65 year old with history of NICM, chronic systolic heart failure,COPD on home oxygen, and DM II was admitted in 1/16 with chest pain and volume overload. Had an ECHO that showed EF 20-25%. Underwent cath as noted below. He was discharged on low dose bb, lisinopril, digoxin, lasix, and spironolactone.  Cardiac MRI in 8/16 showed EF remaining 31%.    He has been doing well generally.  No dyspnea walking on flat ground. No chest pain except with coughing (recent bronchitis).  Main limitation is from pain in his right ankle => prior fracture never healed completely.  Sometimes, his BP will run low (SBP in 90s).  When this happens, he will get lightheaded.  Most of the time, SBP is running in the 110s range.  No palpitations.   Weight is stable.  No orthopnea.   CPX 11/08/2014 Peak VO2: 17.8 (70.8% predicted peak VO2)  VE/VCO2 slope: 25.3  OUES: 2.27 Peak RER:  1.00  RHC/LHC 07/18/2014 RA: 5 RV: 29/8/9 PA: 30/11 (21) PCWP: 16 Fick CO/CI: 6.0/3.0 PVR: 0.8 WU SVR: 1042 Ao sat: 94% PA sats: 73%, 74% SVC sat: 72% Large vessel. Wraps apex. Small D1. Large D2. 20-30% plaque in proximal and distal LAD and D2  - ECHO 07/15/14 EF 20-25% Grade III DD - ECHO 10/18/2014 EF 25-30%, diffuse hypokinesis, mildly decreased RV systolic function with normal RV size.  - Cardiac MRI (8/16) with EF 31%, moderate LVH, normal RV size with mildly decreased systolic function, LGE at the RV insertion sites (nonspecific, suggestive of volume overload).  - Echo 05/01/15 EF 40% with diffuse hypokinesis.   Labs 07/13/2014 BNP 877 Labs 07/19/2014 K 4.5 Creatinine 1.12 Labs 07/25/2014 Dig level 1.7./   Labs 08/16/2014 K 4.5 Creatinine 1.07  Labs 09/22/2014: K 3.8 creatinine 1.14 Dig level 0.2  Labs 10/26/2014: K 4.2 Creatinine 1.09   Labs 6/16: K 4.6,  creatinine 1.26 Labs 12/12/14: K 4.3 Creatinine 1.09 Labs 9/16: K 4.1, Na 129, creatinine 1.09, digoxin 0.3 Labs 10/16: K 4.7, creatinine 1.02, LDL 112, AST 47, ALT 65 Labs 1/17: K 4.5, creatinine 1.17, LDL 29, HDL 23 Labs 2/17: K 5.1, creatinine 1.16, digoxin 0.4 Labs 5/17: digoxin 0.6 Labs 6/17: K 4.8, creatinine 1.35, AST 43, ALT 44 Labs 9/17: K 4.9, creatinine 1.08, digoxin 0.4  Social History: He lives in [place] with his wife. Quit smoking many years ago. No alcohol use. No illicit drug use. Usually independent with daily activities.  Family History: Diabetes.  No CHF or CAD.  ROS: All systems negative except as listed in HPI, PMH and Problem List.  SH:  Social History   Social History  . Marital status: Married    Spouse name: N/A  . Number of children: N/A  . Years of education: N/A   Occupational History  . Not on file.   Social History Main Topics  . Smoking status: Former Smoker    Start date: 10/24/2010  . Smokeless tobacco: Not on file  . Alcohol use Yes  . Drug use: No  . Sexual activity: Not on file   Other Topics Concern  . Not on file   Social History Narrative  . No narrative on file    Past Medical History:  Diagnosis Date  . Chronic systolic CHF (congestive heart  failure) (HCC)   . COPD (chronic obstructive pulmonary disease) (HCC)   . Diabetes mellitus, type II (HCC)   . Obesity     Current Outpatient Prescriptions  Medication Sig Dispense Refill  . albuterol (PROVENTIL HFA;VENTOLIN HFA) 108 (90 BASE) MCG/ACT inhaler Inhale 1 puff into the lungs every 6 (six) hours as needed for wheezing or shortness of breath (sometimes allergy). Reported on 07/24/2015    . aspirin 81 MG EC tablet TAKE 1 TABLET (81 MG TOTAL) BY MOUTH DAILY. 30 tablet 5  . carvedilol (COREG) 12.5 MG tablet TAKE 1 TABLET BY MOUTH 2 TIMES DAILY WITH A MEAL. 60 tablet 6  . CORLANOR 5 MG TABS tablet TAKE 1 BY MOUTH TWICE DAILY WITH A MEAL 180 tablet 3  . DIGOX 125 MCG tablet TAKE  1/2 TABLET (62.5 MCG TOTAL) BY MOUTH DAILY. 90 tablet 3  . docusate sodium (COLACE) 100 MG capsule Take 100 mg by mouth 2 (two) times daily as needed for mild constipation. Reported on 10/31/2015    . Empagliflozin-Linagliptin (GLYXAMBI) 25-5 MG TABS Take by mouth.    Marland Kitchen glipiZIDE (GLUCOTROL) 10 MG tablet Take 10 mg by mouth 2 (two) times daily before a meal.    . lisinopril (PRINIVIL,ZESTRIL) 5 MG tablet Take 1.5 tablets (7.5 mg total) by mouth 2 (two) times daily. 90 tablet 6  . loperamide (IMODIUM A-D) 2 MG tablet Take 2 mg by mouth 4 (four) times daily as needed for diarrhea or loose stools.    . metFORMIN (GLUCOPHAGE) 500 MG tablet Take 1,000 mg by mouth 2 (two) times daily with a meal.     . pantoprazole (PROTONIX) 40 MG tablet TAKE 1 TABLET (40 MG TOTAL) BY MOUTH DAILY. 30 tablet 5  . rosuvastatin (CRESTOR) 20 MG tablet Take 20 mg by mouth daily.    . simethicone (MYLICON) 80 MG chewable tablet Chew 1 tablet (80 mg total) by mouth every 6 (six) hours as needed for flatulence. 30 tablet 0  . spironolactone (ALDACTONE) 25 MG tablet TAKE 1 TABLET (25 MG TOTAL) BY MOUTH EVERY EVENING. 30 tablet 4  . furosemide (LASIX) 40 MG tablet Take 40 mg by mouth every other day.     No current facility-administered medications for this encounter.     Vitals:   05/27/16 0914  BP: 110/68  Pulse: 78  SpO2: 98%  Weight: 220 lb (99.8 kg)   Wt Readings from Last 3 Encounters:  05/27/16 220 lb (99.8 kg)  02/26/16 219 lb (99.3 kg)  10/31/15 217 lb 12 oz (98.8 kg)     PHYSICAL EXAM: General:  Well appearing. NAD HEENT: normal Neck: supple. JVP 6-7 cm. Carotids 2+ bilaterally; no bruits. No thyromegaly or nodule noted. Cor: PMI normal. RRR. No M/G/R Lungs: Clear, normal effort Abdomen: obese, soft, non-tender, non-distended, no HSM. No bruits or masses. +BS Extremities: no cyanosis, clubbing, rash. No edema Neuro: alert & orientedx3, cranial nerves grossly intact. Moves all 4 extremities w/o  difficulty. Affect pleasant.  ASSESSMENT & PLAN:  1. 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  Had cath 07/18/2014 without significant coronary disease. ECHO 06/2014 EF 20-25% => ECHO 5/16 EF 25-30%.  Cardiac MRI in 8/16 with EF 31%, RV insertion site LGE pattern, nonspecific.  Echo in 11/16 showed some improvement in EF to 40%.  Currently out of range for ICD.  NYHA class II.  Volume status stable. - He is no longer taking Lasix.  Can use prn.   - Continue spironolactone  25 mg daily.   - Continue Coreg 12.5 mg bid  - Increase lisinopril to 7.5 mg bid (slow increase given orthostatic symptoms when BP gets too low). BMET today and repeat in 2 wks.  - Continue digoxin 0.0625 mg daily, check level.  - Continue Corlanor 5 mg twice a day.    - I will get a repeat echo.  2.  Hyperlipidemia: Continue Crestor.  Check lipids today.   Followup in 4 months.   Marca AnconaDalton [name] 05/27/2016

## 2016-05-30 ENCOUNTER — Encounter (HOSPITAL_COMMUNITY): Payer: Self-pay

## 2016-05-30 NOTE — Progress Notes (Signed)
FMLA on behalf of spouse completed and signed by Dr Gala Romney. Faxed to attn: Diante Saxon (Spouse) to provided fax # [phone] Copy of forms scanned into patient's electronic medical record.  Ave Filter, RN

## 2016-06-18 ENCOUNTER — Other Ambulatory Visit (HOSPITAL_COMMUNITY): Payer: Self-pay | Admitting: *Deleted

## 2016-06-18 DIAGNOSIS — I5022 Chronic systolic (congestive) heart failure: Secondary | ICD-10-CM

## 2016-06-18 MED ORDER — LISINOPRIL 5 MG PO TABS: 7.5000 mg | ORAL_TABLET | Freq: Two times a day (BID) | ORAL | 3 refills | Status: DC

## 2016-06-19 ENCOUNTER — Other Ambulatory Visit (HOSPITAL_COMMUNITY): Payer: Self-pay | Admitting: *Deleted

## 2016-06-19 DIAGNOSIS — I5022 Chronic systolic (congestive) heart failure: Secondary | ICD-10-CM

## 2016-06-19 MED ORDER — LISINOPRIL 5 MG PO TABS: 7.5000 mg | ORAL_TABLET | Freq: Two times a day (BID) | ORAL | 3 refills | Status: DC

## 2016-07-01 ENCOUNTER — Ambulatory Visit (HOSPITAL_COMMUNITY): Payer: BLUE CROSS/BLUE SHIELD | Attending: Cardiovascular Disease

## 2016-07-01 ENCOUNTER — Encounter (INDEPENDENT_AMBULATORY_CARE_PROVIDER_SITE_OTHER): Payer: Self-pay

## 2016-07-01 ENCOUNTER — Other Ambulatory Visit: Payer: Self-pay

## 2016-07-01 DIAGNOSIS — I5022 Chronic systolic (congestive) heart failure: Secondary | ICD-10-CM

## 2016-07-16 ENCOUNTER — Encounter: Payer: Self-pay | Admitting: Cardiology

## 2016-07-16 DIAGNOSIS — Z125 Encounter for screening for malignant neoplasm of prostate: Secondary | ICD-10-CM | POA: Diagnosis not present

## 2016-07-16 DIAGNOSIS — I509 Heart failure, unspecified: Secondary | ICD-10-CM | POA: Diagnosis not present

## 2016-07-16 DIAGNOSIS — E1165 Type 2 diabetes mellitus with hyperglycemia: Secondary | ICD-10-CM | POA: Diagnosis not present

## 2016-07-16 DIAGNOSIS — J449 Chronic obstructive pulmonary disease, unspecified: Secondary | ICD-10-CM | POA: Diagnosis not present

## 2016-07-16 DIAGNOSIS — Z0000 Encounter for general adult medical examination without abnormal findings: Secondary | ICD-10-CM | POA: Diagnosis not present

## 2016-07-16 DIAGNOSIS — I429 Cardiomyopathy, unspecified: Secondary | ICD-10-CM | POA: Diagnosis not present

## 2016-07-24 ENCOUNTER — Other Ambulatory Visit (HOSPITAL_COMMUNITY): Payer: Self-pay | Admitting: Cardiology

## 2016-07-25 ENCOUNTER — Encounter (HOSPITAL_COMMUNITY): Payer: Self-pay | Admitting: *Deleted

## 2016-07-25 ENCOUNTER — Telehealth (HOSPITAL_COMMUNITY): Payer: Self-pay | Admitting: *Deleted

## 2016-07-25 NOTE — Telephone Encounter (Signed)
ECHOCARDIOGRAM COMPLETE  Order: [id number]  Status:  Final result Visible to patient:  No (Not Released) Dx:  Chronic systolic CHF (congestive hear...  Notes Recorded by Suezanne Cheshire, RN on 07/25/2016 at 1:55 PM EST Called patient and left another VM. Letter mailed today. ------  Notes Recorded by Noralee Space, RN on 07/11/2016 at 2:28 PM EST Left message to call back ------  Notes Recorded by Suezanne Cheshire, RN on 07/09/2016 at 9:58 AM EST Called and left message to call us back. ------  Notes Recorded by Noralee Space, RN on 07/05/2016 at 2:20 PM EST Left message to call back ------  Notes Recorded by Laurey Morale, MD on 07/04/2016 at 11:52 AM EST Good news, EF is improved, almost normal.

## 2016-08-01 ENCOUNTER — Telehealth (HOSPITAL_COMMUNITY): Payer: Self-pay | Admitting: *Deleted

## 2016-08-01 NOTE — Telephone Encounter (Signed)
Opened in error

## 2016-08-01 NOTE — Telephone Encounter (Signed)
Patient's wife called for results.  Discussed Echo results and she understands and will forward message to patient.  No further questions at this time.

## 2016-10-02 ENCOUNTER — Other Ambulatory Visit (HOSPITAL_COMMUNITY): Payer: Self-pay | Admitting: Cardiology

## 2016-10-04 ENCOUNTER — Other Ambulatory Visit (HOSPITAL_COMMUNITY): Payer: Self-pay | Admitting: Internal Medicine

## 2016-10-14 DIAGNOSIS — E781 Pure hyperglyceridemia: Secondary | ICD-10-CM | POA: Diagnosis not present

## 2016-10-14 DIAGNOSIS — I429 Cardiomyopathy, unspecified: Secondary | ICD-10-CM | POA: Diagnosis not present

## 2016-10-14 DIAGNOSIS — E1165 Type 2 diabetes mellitus with hyperglycemia: Secondary | ICD-10-CM | POA: Diagnosis not present

## 2016-10-14 DIAGNOSIS — J449 Chronic obstructive pulmonary disease, unspecified: Secondary | ICD-10-CM | POA: Diagnosis not present

## 2016-10-16 ENCOUNTER — Telehealth (HOSPITAL_COMMUNITY): Payer: Self-pay | Admitting: Cardiology

## 2016-10-16 NOTE — Telephone Encounter (Signed)
Received labs results from PCP, patient due follow up with Dr [facility]  Left message on voicemail

## 2016-10-21 NOTE — Telephone Encounter (Signed)
Patient scheduled for follow up on 12/13/16

## 2016-10-25 ENCOUNTER — Other Ambulatory Visit (HOSPITAL_COMMUNITY): Payer: Self-pay | Admitting: Cardiology

## 2016-11-05 DIAGNOSIS — N179 Acute kidney failure, unspecified: Secondary | ICD-10-CM | POA: Diagnosis not present

## 2016-11-05 DIAGNOSIS — E1165 Type 2 diabetes mellitus with hyperglycemia: Secondary | ICD-10-CM | POA: Diagnosis not present

## 2016-11-18 ENCOUNTER — Other Ambulatory Visit (HOSPITAL_COMMUNITY): Payer: Self-pay | Admitting: Cardiology

## 2016-11-18 DIAGNOSIS — I5022 Chronic systolic (congestive) heart failure: Secondary | ICD-10-CM

## 2016-12-11 ENCOUNTER — Telehealth (HOSPITAL_COMMUNITY): Payer: Self-pay | Admitting: Vascular Surgery

## 2016-12-11 NOTE — Telephone Encounter (Signed)
Left pt message to reschedule appt that was canceled via automated service, pt cancel appt 6/29 @ 8:40

## 2016-12-13 ENCOUNTER — Encounter (HOSPITAL_COMMUNITY): Payer: BLUE CROSS/BLUE SHIELD

## 2016-12-19 ENCOUNTER — Other Ambulatory Visit (HOSPITAL_COMMUNITY): Payer: Self-pay | Admitting: Adult Health

## 2017-01-13 DIAGNOSIS — Z7984 Long term (current) use of oral hypoglycemic drugs: Secondary | ICD-10-CM | POA: Diagnosis not present

## 2017-01-13 DIAGNOSIS — E782 Mixed hyperlipidemia: Secondary | ICD-10-CM | POA: Diagnosis not present

## 2017-01-13 DIAGNOSIS — E1165 Type 2 diabetes mellitus with hyperglycemia: Secondary | ICD-10-CM | POA: Diagnosis not present

## 2017-01-16 ENCOUNTER — Ambulatory Visit (HOSPITAL_COMMUNITY)
Admission: RE | Admit: 2017-01-16 | Discharge: 2017-01-16 | Disposition: A | Discharge status: Home / Self Care | Payer: BLUE CROSS/BLUE SHIELD | Source: Ambulatory Visit | Attending: Cardiology | Admitting: Cardiology

## 2017-01-16 VITALS — BP 138/64 | HR 74 | Wt 221.8 lb

## 2017-01-16 DIAGNOSIS — I429 Cardiomyopathy, unspecified: Secondary | ICD-10-CM | POA: Diagnosis not present

## 2017-01-16 DIAGNOSIS — Z7984 Long term (current) use of oral hypoglycemic drugs: Secondary | ICD-10-CM | POA: Insufficient documentation

## 2017-01-16 DIAGNOSIS — Z9981 Dependence on supplemental oxygen: Secondary | ICD-10-CM | POA: Insufficient documentation

## 2017-01-16 DIAGNOSIS — J449 Chronic obstructive pulmonary disease, unspecified: Secondary | ICD-10-CM | POA: Insufficient documentation

## 2017-01-16 DIAGNOSIS — Z833 Family history of diabetes mellitus: Secondary | ICD-10-CM | POA: Diagnosis not present

## 2017-01-16 DIAGNOSIS — Z7982 Long term (current) use of aspirin: Secondary | ICD-10-CM | POA: Diagnosis not present

## 2017-01-16 DIAGNOSIS — I5022 Chronic systolic (congestive) heart failure: Secondary | ICD-10-CM | POA: Diagnosis not present

## 2017-01-16 DIAGNOSIS — E785 Hyperlipidemia, unspecified: Secondary | ICD-10-CM | POA: Insufficient documentation

## 2017-01-16 DIAGNOSIS — Z87891 Personal history of nicotine dependence: Secondary | ICD-10-CM | POA: Insufficient documentation

## 2017-01-16 DIAGNOSIS — E119 Type 2 diabetes mellitus without complications: Secondary | ICD-10-CM | POA: Insufficient documentation

## 2017-01-16 DIAGNOSIS — E669 Obesity, unspecified: Secondary | ICD-10-CM | POA: Insufficient documentation

## 2017-01-16 LAB — BASIC METABOLIC PANEL
ANION GAP: 9 (ref 5–15)
BUN: 18 mg/dL (ref 6–20)
CO2: 24 mmol/L (ref 22–32)
CREATININE: 0.83 mg/dL (ref 0.61–1.24)
Calcium: 9.4 mg/dL (ref 8.9–10.3)
Chloride: 101 mmol/L (ref 101–111)
GLUCOSE: 195 mg/dL — AB (ref 65–99)
POTASSIUM: 5.2 mmol/L — AB (ref 3.5–5.1)
Sodium: 134 mmol/L — ABNORMAL LOW (ref 135–145)

## 2017-01-16 NOTE — Patient Instructions (Signed)
STOP Digoxin.  Routine lab work today. Will notify you of abnormal results, otherwise no news is good news!  Follow up 6 months with Dr. Shirlee Latch.  Take all medication as prescribed the day of your appointment. Bring all medications with you to your appointment.  Do the following things EVERYDAY: 1) Weigh yourself in the morning before breakfast. Write it down and keep it in a log. 2) Take your medicines as prescribed 3) Eat low salt foods-Limit salt (sodium) to 2000 mg per day.  4) Stay as active as you can everyday 5) Limit all fluids for the day to less than 2 liters

## 2017-01-18 NOTE — Progress Notes (Signed)
Patient ID: [name], male   DOB: [birth year], 66 y.o.   MRN: [id number]   Advanced Heart Failure Clinic Note   PCP: Dr. Kirtland Bouchard. [facility]: Dr. Shirlee Latch  HPI: Mr [place] is a 66 year old with history of NICM, chronic systolic heart failure,COPD on home oxygen, and DM II was admitted in 1/16 with chest pain and volume overload. Had an ECHO that showed EF 20-25%. Underwent cath as noted below. He was discharged on low dose bb, lisinopril, digoxin, lasix, and spironolactone.  Cardiac MRI in 8/16 showed EF remaining 31%.  Echo (1/18) with EF 50-55%, mild LV dilation.   Doing well overall.  Weight stable.  Thinking about moving to [facility].  Does a lot of fishing.  No exertional dyspnea or chest pain.  No dyspnea walking up stairs.  [place]ardens, does home projects (built deck recently).  Takes Lasix once a week or so when ankles swell.   CPX 11/08/2014 Peak VO2: 17.8 (70.8% predicted peak VO2)  VE/VCO2 slope: 25.3  OUES: 2.27 Peak RER:  1.00  RHC/LHC 07/18/2014 RA: 5 RV: 29/8/9 PA: 30/11 (21) PCWP: 16 Fick CO/CI: 6.0/3.0 PVR: 0.8 WU SVR: 1042 Ao sat: 94% PA sats: 73%, 74% SVC sat: 72% Large vessel. Wraps apex. Small D1. Large D2. 20-30% plaque in proximal and distal LAD and D2  - ECHO 07/15/14 EF 20-25% Grade III DD - ECHO 10/18/2014 EF 25-30%, diffuse hypokinesis, mildly decreased RV systolic function with normal RV size.  - Cardiac MRI (8/16) with EF 31%, moderate LVH, normal RV size with mildly decreased systolic function, LGE at the RV insertion sites (nonspecific, suggestive of volume overload).  - Echo 05/01/15 EF 40% with diffuse hypokinesis.  - Echo (1/18): EF 50-55%, mild LV dilation  Labs 07/13/2014 BNP 877 Labs 07/19/2014 K 4.5 Creatinine 1.12 Labs 07/25/2014 Dig level 1.7./   Labs 08/16/2014 K 4.5 Creatinine 1.07  Labs 09/22/2014: K 3.8 creatinine 1.14 Dig level 0.2  Labs 10/26/2014: K 4.2 Creatinine 1.09   Labs 6/16: K 4.6, creatinine 1.26 Labs 12/12/14: K 4.3 Creatinine  1.09 Labs 9/16: K 4.1, Na 129, creatinine 1.09, digoxin 0.3 Labs 10/16: K 4.7, creatinine 1.02, LDL 112, AST 47, ALT 65 Labs 1/17: K 4.5, creatinine 1.17, LDL 29, HDL 23 Labs 2/17: K 5.1, creatinine 1.16, digoxin 0.4 Labs 5/17: digoxin 0.6 Labs 6/17: K 4.8, creatinine 1.35, AST 43, ALT 44 Labs 9/17: K 4.9, creatinine 1.08, digoxin 0.4 Labs 12/17: digoxin 0.4, LDL 37, HDL 27, K 4.6, creatinine 5.62 Labs 4/18: K 5, creatinine 1.5, LDL 49  Social History: He lives in [place] with his wife. Quit smoking many years ago. No alcohol use. No illicit drug use. Usually independent with daily activities.  Family History: Diabetes.  No CHF or CAD.  ROS: All systems negative except as listed in HPI, PMH and Problem List.  SH:  Social History   Social History  . Marital status: Married    Spouse name: N/A  . Number of children: N/A  . Years of education: N/A   Occupational History  . Not on file.   Social History Main Topics  . Smoking status: Former Smoker    Start date: 10/24/2010  . Smokeless tobacco: Not on file  . Alcohol use Yes  . Drug use: No  . Sexual activity: Not on file   Other Topics Concern  . Not on file   Social History Narrative  . No narrative on file    Past Medical History:  Diagnosis Date  . Chronic systolic CHF (congestive heart failure) (HCC)   . COPD (chronic obstructive pulmonary disease) (HCC)   . Diabetes mellitus, type II (HCC)   . Obesity     Current Outpatient Prescriptions  Medication Sig Dispense Refill  . albuterol (PROVENTIL HFA;VENTOLIN HFA) 108 (90 BASE) MCG/ACT inhaler Inhale 1 puff into the lungs every 6 (six) hours as needed for wheezing or shortness of breath (sometimes allergy). Reported on 07/24/2015    . aspirin 81 MG EC tablet TAKE 1 TABLET (81 MG TOTAL) BY MOUTH DAILY. 30 tablet 5  . carvedilol (COREG) 12.5 MG tablet TAKE 1 TABLET BY MOUTH 2 TIMES DAILY WITH A MEAL. 60 tablet 7  . CORLANOR 5 MG TABS tablet TAKE 1 BY MOUTH TWICE DAILY  WITH A MEAL 180 tablet 3  . docusate sodium (COLACE) 100 MG capsule Take 100 mg by mouth 2 (two) times daily as needed for mild constipation. Reported on 10/31/2015    . Empagliflozin-Linagliptin (GLYXAMBI) 25-5 MG TABS Take by mouth.    . furosemide (LASIX) 40 MG tablet Take 40 mg by mouth every other day.    Marland Kitchen glipiZIDE (GLUCOTROL) 10 MG tablet Take 10 mg by mouth 2 (two) times daily before a meal.    . lisinopril (PRINIVIL,ZESTRIL) 5 MG tablet TAKE 1 AND 1/2 TABLETS BY MOUTH 2 TIMES DAILY. 90 tablet 3  . loperamide (IMODIUM A-D) 2 MG tablet Take 2 mg by mouth 4 (four) times daily as needed for diarrhea or loose stools.    . metFORMIN (GLUCOPHAGE) 500 MG tablet Take 1,000 mg by mouth 2 (two) times daily with a meal.     . pantoprazole (PROTONIX) 40 MG tablet TAKE 1 TABLET (40 MG TOTAL) BY MOUTH DAILY. 30 tablet 5  . rosuvastatin (CRESTOR) 20 MG tablet Take 20 mg by mouth daily.    . simethicone (MYLICON) 80 MG chewable tablet Chew 1 tablet (80 mg total) by mouth every 6 (six) hours as needed for flatulence. 30 tablet 0  . spironolactone (ALDACTONE) 25 MG tablet TAKE 1 TABLET (25 MG TOTAL) BY MOUTH EVERY EVENING. 30 tablet 4   No current facility-administered medications for this encounter.     Vitals:   01/16/17 1025  BP: 138/64  Pulse: 74  SpO2: 96%  Weight: 221 lb 12.8 oz (100.6 kg)   Wt Readings from Last 3 Encounters:  01/16/17 221 lb 12.8 oz (100.6 kg)  05/27/16 220 lb (99.8 kg)  02/26/16 219 lb (99.3 kg)     PHYSICAL EXAM: General: NAD Neck: No JVD, no thyromegaly or thyroid nodule.  Lungs: Clear to auscultation bilaterally with normal respiratory effort. CV: Nondisplaced PMI.  Heart regular S1/S2, no S3/S4, no murmur.  No peripheral edema.  No carotid bruit.  Normal pedal pulses.  Abdomen: Soft, nontender, no hepatosplenomegaly, no distention.  Skin: Intact without lesions or rashes.  Neurologic: Alert and oriented x 3.  Psych: Normal affect. Extremities: No clubbing or  cyanosis.  HEENT: Normal.   ASSESSMENT & PLAN:  1. 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  Had cath 07/18/2014 without significant coronary disease. ECHO 06/2014 EF 20-25% => ECHO 5/16 EF 25-30%.  Cardiac MRI in 8/16 with EF 31%, RV insertion site LGE pattern, nonspecific.  Echo in 11/16 showed some improvement in EF to 40%.  Finally, echo in 1/18 showed EF up to 50-55%.  NYHA class II symptoms, not volume overloaded. - Continue Lasix prn.  - Continue spironolactone 25 mg daily.   -  Continue Coreg 12.5 mg bid and lisinopril 7.5 mg bid.  - Continue Corlanor 5 mg twice a day.    - With improvement in EF, he can stop digoxin.  - BMET today.  2.  Hyperlipidemia: Continue Crestor.  Good lipids recently.  3. Type II diabetes: He is on linagliptin.  This class of diabetes meds may worsen CHF symptoms.  Would consider transition to empagliflozin.   Followup in 6 months.   Marca Ancona 01/18/2017

## 2017-01-29 ENCOUNTER — Other Ambulatory Visit (HOSPITAL_COMMUNITY): Payer: Self-pay | Admitting: Internal Medicine

## 2017-01-29 DIAGNOSIS — R945 Abnormal results of liver function studies: Secondary | ICD-10-CM | POA: Diagnosis not present

## 2017-01-30 ENCOUNTER — Other Ambulatory Visit: Payer: Self-pay | Admitting: Family Medicine

## 2017-01-30 DIAGNOSIS — R945 Abnormal results of liver function studies: Secondary | ICD-10-CM

## 2017-01-30 DIAGNOSIS — R7989 Other specified abnormal findings of blood chemistry: Secondary | ICD-10-CM

## 2017-02-05 ENCOUNTER — Ambulatory Visit
Admission: RE | Admit: 2017-02-05 | Discharge: 2017-02-05 | Disposition: A | Discharge status: Home / Self Care | Payer: BLUE CROSS/BLUE SHIELD | Source: Ambulatory Visit | Attending: Family Medicine | Admitting: Family Medicine

## 2017-02-05 DIAGNOSIS — R7989 Other specified abnormal findings of blood chemistry: Secondary | ICD-10-CM

## 2017-02-05 DIAGNOSIS — R945 Abnormal results of liver function studies: Secondary | ICD-10-CM

## 2017-02-07 ENCOUNTER — Telehealth (HOSPITAL_COMMUNITY): Payer: Self-pay | Admitting: *Deleted

## 2017-02-07 ENCOUNTER — Encounter (HOSPITAL_COMMUNITY): Payer: Self-pay | Admitting: *Deleted

## 2017-02-07 NOTE — Telephone Encounter (Signed)
Basic metabolic panel  Order: [id number]  Status:  Final result Visible to patient:  No (Not Released) Dx:  Chronic systolic CHF (congestive hear...  Notes recorded by Georgina Peer, RN on 02/07/2017 at 1:21 PM EDT Called and left another VM will mail letter today. ------  Notes recorded by Georgina Peer, RN on 01/29/2017 at 10:51 AM EDT Called and left message asking for him to call us back. ------  Notes recorded by Georgina Peer, RN on 01/21/2017 at 12:14 PM EDT Called and left message asking for him to call us back. ------  Notes recorded by Laurey Morale, MD on 01/17/2017 at 4:08 PM EDT K mildly elevated. Low K diet and no K supplement.

## 2017-02-24 DIAGNOSIS — E1165 Type 2 diabetes mellitus with hyperglycemia: Secondary | ICD-10-CM | POA: Diagnosis not present

## 2017-03-03 ENCOUNTER — Other Ambulatory Visit (HOSPITAL_COMMUNITY): Payer: Self-pay | Admitting: Internal Medicine

## 2017-03-11 ENCOUNTER — Other Ambulatory Visit (HOSPITAL_COMMUNITY): Payer: Self-pay | Admitting: Internal Medicine

## 2017-04-16 DIAGNOSIS — E1165 Type 2 diabetes mellitus with hyperglycemia: Secondary | ICD-10-CM | POA: Diagnosis not present

## 2017-04-16 DIAGNOSIS — Z23 Encounter for immunization: Secondary | ICD-10-CM | POA: Diagnosis not present

## 2017-04-16 DIAGNOSIS — J449 Chronic obstructive pulmonary disease, unspecified: Secondary | ICD-10-CM | POA: Diagnosis not present

## 2017-04-16 DIAGNOSIS — E669 Obesity, unspecified: Secondary | ICD-10-CM | POA: Diagnosis not present

## 2017-04-16 DIAGNOSIS — E782 Mixed hyperlipidemia: Secondary | ICD-10-CM | POA: Diagnosis not present

## 2017-05-05 ENCOUNTER — Other Ambulatory Visit (HOSPITAL_COMMUNITY): Payer: Self-pay | Admitting: Cardiology

## 2017-05-07 ENCOUNTER — Other Ambulatory Visit (HOSPITAL_COMMUNITY): Payer: Self-pay | Admitting: Internal Medicine

## 2017-05-27 ENCOUNTER — Encounter (HOSPITAL_COMMUNITY): Payer: Self-pay

## 2017-05-27 ENCOUNTER — Other Ambulatory Visit (HOSPITAL_COMMUNITY): Payer: Self-pay | Admitting: Internal Medicine

## 2017-05-27 DIAGNOSIS — I5022 Chronic systolic (congestive) heart failure: Secondary | ICD-10-CM

## 2017-05-27 NOTE — Progress Notes (Signed)
FMLA on behalf of spouse/primary caregiver completed and signed by Dr. Shirlee Latch to maintain intermittent FMLA for spouse to take time off as needed for appts and flare ups. Faxed to spouse directly at [facility] at provided fax # [phone] attn: brenda Bollman.  Ave Filter, RN

## 2017-07-29 DIAGNOSIS — E1165 Type 2 diabetes mellitus with hyperglycemia: Secondary | ICD-10-CM | POA: Diagnosis not present

## 2017-07-29 DIAGNOSIS — Z0000 Encounter for general adult medical examination without abnormal findings: Secondary | ICD-10-CM | POA: Diagnosis not present

## 2017-07-29 DIAGNOSIS — Z125 Encounter for screening for malignant neoplasm of prostate: Secondary | ICD-10-CM | POA: Diagnosis not present

## 2017-08-04 ENCOUNTER — Telehealth (HOSPITAL_COMMUNITY): Payer: Self-pay

## 2017-08-04 DIAGNOSIS — E785 Hyperlipidemia, unspecified: Secondary | ICD-10-CM

## 2017-08-04 NOTE — Telephone Encounter (Signed)
Notes recorded by Teresa Coombs, RN on 08/04/2017 at 3:01 PM EST Pt was not taking Crestor b/c of what he read about it's interaction with kidneys. I reasured him the benefits of taking the medication and he agreed to restart Crestor 20 mg daily. 6 month f/u scheduled and Lab appointment made (orders placed)   ------  Notes recorded by Laurey Morale, MD on 08/01/2017 at 3:58 PM EST LDL high at 142. If he is taking Crestor 20, increase to 40. If not on Crestor, go back on 20. Lipids/LFTs 2 months.

## 2017-08-07 ENCOUNTER — Encounter (HOSPITAL_COMMUNITY): Payer: Self-pay | Admitting: Cardiology

## 2017-08-07 ENCOUNTER — Ambulatory Visit (HOSPITAL_COMMUNITY)
Admission: RE | Admit: 2017-08-07 | Discharge: 2017-08-07 | Disposition: A | Discharge status: Home / Self Care | Payer: BLUE CROSS/BLUE SHIELD | Source: Ambulatory Visit | Attending: Cardiology | Admitting: Cardiology

## 2017-08-07 VITALS — BP 103/64 | HR 76 | Wt 226.4 lb

## 2017-08-07 DIAGNOSIS — J449 Chronic obstructive pulmonary disease, unspecified: Secondary | ICD-10-CM | POA: Diagnosis not present

## 2017-08-07 DIAGNOSIS — Z87891 Personal history of nicotine dependence: Secondary | ICD-10-CM | POA: Insufficient documentation

## 2017-08-07 DIAGNOSIS — Z7984 Long term (current) use of oral hypoglycemic drugs: Secondary | ICD-10-CM | POA: Insufficient documentation

## 2017-08-07 DIAGNOSIS — E785 Hyperlipidemia, unspecified: Secondary | ICD-10-CM | POA: Insufficient documentation

## 2017-08-07 DIAGNOSIS — E119 Type 2 diabetes mellitus without complications: Secondary | ICD-10-CM | POA: Insufficient documentation

## 2017-08-07 DIAGNOSIS — Z7982 Long term (current) use of aspirin: Secondary | ICD-10-CM | POA: Insufficient documentation

## 2017-08-07 DIAGNOSIS — I5022 Chronic systolic (congestive) heart failure: Secondary | ICD-10-CM | POA: Diagnosis not present

## 2017-08-07 DIAGNOSIS — I429 Cardiomyopathy, unspecified: Secondary | ICD-10-CM | POA: Diagnosis not present

## 2017-08-07 DIAGNOSIS — Z79899 Other long term (current) drug therapy: Secondary | ICD-10-CM | POA: Insufficient documentation

## 2017-08-07 NOTE — Patient Instructions (Signed)
Your physician has requested that you have an echocardiogram. Echocardiography is a painless test that uses sound waves to create images of your heart. It provides your doctor with information about the size and shape of your heart and how well your heart's chambers and valves are working. This procedure takes approximately one hour. There are no restrictions for this procedure.  (they will call you)    Your physician recommends that you schedule a follow-up appointment in: 6 months with Dr. Shirlee Latch  (August), (we will call you)

## 2017-08-07 NOTE — Progress Notes (Signed)
Patient ID: [name], male   DOB: January 09, 1951, 67 y.o.   MRN: [id number]   Advanced Heart Failure Clinic Note   PCP: Dr. Kirtland Bouchard. [facility]: Dr. Shirlee Latch  HPI: [name] is a 67 y.o. with history of NICM, chronic systolic heart failure, COPD, and DM II who was admitted in 1/16 with chest pain and volume overload. Had an ECHO that showed EF 20-25%. Underwent cath as noted below. He was discharged on low dose bb, lisinopril, digoxin, lasix, and spironolactone.  Cardiac MRI in 8/16 showed EF remaining 31%.  Echo (1/18) with EF 50-55%, mild LV dilation.   He returns for followup of CHF.  Generally, he feels like his energy level is not as high as in the past, but he denies exertional dyspnea.  No chest pain.  No orthopnea/PND.  His main limitation is arthritis pain. LDL was high when recently checked, but he was off Crestor for several months.  He has now restarted Crestor.  He plans to move to [facility].   CPX 11/08/2014 Peak VO2: 17.8 (70.8% predicted peak VO2)  VE/VCO2 slope: 25.3  OUES: 2.27 Peak RER:  1.00  RHC/LHC 07/18/2014 RA: 5 RV: 29/8/9 PA: 30/11 (21) PCWP: 16 Fick CO/CI: 6.0/3.0 PVR: 0.8 WU SVR: 1042 Ao sat: 94% PA sats: 73%, 74% SVC sat: 72% Large vessel. Wraps apex. Small D1. Large D2. 20-30% plaque in proximal and distal LAD and D2  - ECHO 07/15/14 EF 20-25% Grade III DD - ECHO 10/18/2014 EF 25-30%, diffuse hypokinesis, mildly decreased RV systolic function with normal RV size.  - Cardiac MRI (8/16) with EF 31%, moderate LVH, normal RV size with mildly decreased systolic function, LGE at the RV insertion sites (nonspecific, suggestive of volume overload).  - Echo 05/01/15 EF 40% with diffuse hypokinesis.  - Echo (1/18): EF 50-55%, mild LV dilation  Labs 07/13/2014 BNP 877 Labs 07/19/2014 K 4.5 Creatinine 1.12 Labs 07/25/2014 Dig level 1.7./   Labs 08/16/2014 K 4.5 Creatinine 1.07  Labs 09/22/2014: K 3.8 creatinine 1.14 Dig level 0.2  Labs 10/26/2014: K 4.2 Creatinine  1.09   Labs 6/16: K 4.6, creatinine 1.26 Labs 12/12/14: K 4.3 Creatinine 1.09 Labs 9/16: K 4.1, Na 129, creatinine 1.09, digoxin 0.3 Labs 10/16: K 4.7, creatinine 1.02, LDL 112, AST 47, ALT 65 Labs 1/17: K 4.5, creatinine 1.17, LDL 29, HDL 23 Labs 2/17: K 5.1, creatinine 1.16, digoxin 0.4 Labs 5/17: digoxin 0.6 Labs 6/17: K 4.8, creatinine 1.35, AST 43, ALT 44 Labs 9/17: K 4.9, creatinine 1.08, digoxin 0.4 Labs 12/17: digoxin 0.4, LDL 37, HDL 27, K 4.6, creatinine 4.09 Labs 4/18: K 5, creatinine 1.5, LDL 49 Labs 2/19: K 4.7, creatinine 1.12, LDL 142, TGs 259, HDL 41  Social History: He lives in [place] with his wife. Quit smoking many years ago. No alcohol use. No illicit drug use. Usually independent with daily activities.  Family History: Diabetes.  No CHF or CAD.  ROS: All systems negative except as listed in HPI, PMH and Problem List.  SH:  Social History   Socioeconomic History  . Marital status: Married    Spouse name: Not on file  . Number of children: Not on file  . Years of education: Not on file  . Highest education level: Not on file  Social Needs  . Financial resource strain: Not on file  . Food insecurity - worry: Not on file  . Food insecurity - inability: Not on file  . Transportation needs - medical: Not on  file  . Transportation needs - non-medical: Not on file  Occupational History  . Not on file  Tobacco Use  . Smoking status: Former Smoker    Start date: 10/24/2010  . Smokeless tobacco: Never Used  Substance and Sexual Activity  . Alcohol use: Yes  . Drug use: No  . Sexual activity: Not on file  Other Topics Concern  . Not on file  Social History Narrative  . Not on file    Past Medical History:  Diagnosis Date  . Chronic systolic CHF (congestive heart failure) (HCC)   . COPD (chronic obstructive pulmonary disease) (HCC)   . Diabetes mellitus, type II (HCC)   . Obesity     Current Outpatient Medications  Medication Sig Dispense Refill  .  carvedilol (COREG) 12.5 MG tablet TAKE 1 TABLET BY MOUTH 2 TIMES DAILY WITH A MEAL. 60 tablet 8  . CORLANOR 5 MG TABS tablet TAKE 1 TABLET BY MOUTH TWICE DAILY WITH MEALS 180 tablet 3  . furosemide (LASIX) 40 MG tablet Take 40 mg by mouth once a week. prn    . lisinopril (PRINIVIL,ZESTRIL) 5 MG tablet TAKE 1 AND 1/2 TABLETS BY MOUTH 2 TIMES DAILY. 90 tablet 3  . QC LO-DOSE ASPIRIN 81 MG EC tablet TAKE 1 TABLET BY MOUTH DAILY. 30 tablet 11  . rosuvastatin (CRESTOR) 20 MG tablet Take 20 mg by mouth daily.    Marland Kitchen spironolactone (ALDACTONE) 25 MG tablet TAKE 1 TABLET BY MOUTH EVERY EVENING. 30 tablet 4  . albuterol (PROVENTIL HFA;VENTOLIN HFA) 108 (90 BASE) MCG/ACT inhaler Inhale 1 puff into the lungs every 6 (six) hours as needed for wheezing or shortness of breath (sometimes allergy). Reported on 07/24/2015    . docusate sodium (COLACE) 100 MG capsule Take 100 mg by mouth 2 (two) times daily as needed for mild constipation. Reported on 10/31/2015    . Empagliflozin-Linagliptin (GLYXAMBI) 25-5 MG TABS Take by mouth.    Marland Kitchen glipiZIDE (GLUCOTROL) 10 MG tablet Take 10 mg by mouth 2 (two) times daily before a meal.    . loperamide (IMODIUM A-D) 2 MG tablet Take 2 mg by mouth 4 (four) times daily as needed for diarrhea or loose stools.    . metFORMIN (GLUCOPHAGE) 500 MG tablet Take 1,000 mg by mouth 2 (two) times daily with a meal.     . pantoprazole (PROTONIX) 40 MG tablet TAKE 1 TABLET (40 MG TOTAL) BY MOUTH DAILY. 30 tablet 5  . simethicone (MYLICON) 80 MG chewable tablet Chew 1 tablet (80 mg total) by mouth every 6 (six) hours as needed for flatulence. 30 tablet 0   No current facility-administered medications for this encounter.     Vitals:   08/07/17 0940  BP: 103/64  Pulse: 76  SpO2: 92%  Weight: 226 lb 6.4 oz (102.7 kg)   Wt Readings from Last 3 Encounters:  08/07/17 226 lb 6.4 oz (102.7 kg)  01/16/17 221 lb 12.8 oz (100.6 kg)  05/27/16 220 lb (99.8 kg)     PHYSICAL EXAM: General:  NAD Neck: No JVD, no thyromegaly or thyroid nodule.  Lungs: Clear to auscultation bilaterally with normal respiratory effort. CV: Nondisplaced PMI.  Heart regular S1/S2, no S3/S4, no murmur.  No peripheral edema.  No carotid bruit.  Normal pedal pulses.  Abdomen: Soft, nontender, no hepatosplenomegaly, no distention.  Skin: Intact without lesions or rashes.  Neurologic: Alert and oriented x 3.  Psych: Normal affect. Extremities: No clubbing or cyanosis.  HEENT: Normal.  ASSESSMENT & PLAN:  1. 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  Had cath 07/18/2014 without significant coronary disease. ECHO 06/2014 EF 20-25% => ECHO 5/16 EF 25-30%.  Cardiac MRI in 8/16 with EF 31%, RV insertion site LGE pattern, nonspecific.  Echo in 11/16 showed some improvement in EF to 40%.  Finally, echo in 1/18 showed EF up to 50-55%. NYHA class II symptoms.  He is not volume overloaded.  - Continue Lasix prn.  - Continue spironolactone 25 mg daily.   - Continue Coreg 12.5 mg bid and lisinopril 7.5 mg bid.  - Continue Corlanor 5 mg twice a day.    - I will arrange for echo to make sure EF is staying up.   - K and creatinine stable on recent BMET.  2.  Hyperlipidemia: He has restarted Crestor.  Repeat Lipids in 2 months.  3. Type II diabetes: He is on empagliflozin.   Followup in 6 months.   Marca Ancona 08/07/2017

## 2017-08-26 ENCOUNTER — Other Ambulatory Visit (HOSPITAL_COMMUNITY): Payer: Self-pay | Admitting: Internal Medicine

## 2017-08-27 ENCOUNTER — Other Ambulatory Visit (HOSPITAL_COMMUNITY): Payer: Self-pay | Admitting: Internal Medicine

## 2017-08-27 DIAGNOSIS — I5022 Chronic systolic (congestive) heart failure: Secondary | ICD-10-CM

## 2017-09-08 ENCOUNTER — Ambulatory Visit (HOSPITAL_COMMUNITY): Payer: BLUE CROSS/BLUE SHIELD | Attending: Cardiology

## 2017-09-08 DIAGNOSIS — R0989 Other specified symptoms and signs involving the circulatory and respiratory systems: Secondary | ICD-10-CM

## 2017-09-10 ENCOUNTER — Telehealth (HOSPITAL_COMMUNITY): Payer: Self-pay

## 2017-09-10 NOTE — Telephone Encounter (Signed)
Called Pt b/c he did not get his echo done. His pre-cert expires 09/17/2017

## 2017-10-07 ENCOUNTER — Other Ambulatory Visit (HOSPITAL_COMMUNITY): Payer: BLUE CROSS/BLUE SHIELD

## 2017-10-14 ENCOUNTER — Encounter (HOSPITAL_COMMUNITY): Payer: Self-pay | Admitting: Radiology

## 2017-12-06 ENCOUNTER — Other Ambulatory Visit (HOSPITAL_COMMUNITY): Payer: Self-pay | Admitting: Internal Medicine

## 2017-12-06 DIAGNOSIS — I5022 Chronic systolic (congestive) heart failure: Secondary | ICD-10-CM

## 2018-01-21 DIAGNOSIS — J019 Acute sinusitis, unspecified: Secondary | ICD-10-CM | POA: Diagnosis not present

## 2018-02-04 ENCOUNTER — Other Ambulatory Visit (HOSPITAL_COMMUNITY): Payer: Self-pay | Admitting: Internal Medicine

## 2018-02-11 ENCOUNTER — Other Ambulatory Visit (HOSPITAL_COMMUNITY): Payer: Self-pay | Admitting: Cardiology

## 2018-02-19 ENCOUNTER — Other Ambulatory Visit (HOSPITAL_COMMUNITY): Payer: Self-pay | Admitting: Cardiology

## 2018-06-06 ENCOUNTER — Other Ambulatory Visit (HOSPITAL_COMMUNITY): Payer: Self-pay | Admitting: Internal Medicine

## 2018-06-20 ENCOUNTER — Other Ambulatory Visit (HOSPITAL_COMMUNITY): Payer: Self-pay | Admitting: Internal Medicine

## 2018-06-20 DIAGNOSIS — I5022 Chronic systolic (congestive) heart failure: Secondary | ICD-10-CM

## 2018-06-23 ENCOUNTER — Other Ambulatory Visit (HOSPITAL_COMMUNITY): Payer: Self-pay | Admitting: Internal Medicine

## 2018-06-23 DIAGNOSIS — I5022 Chronic systolic (congestive) heart failure: Secondary | ICD-10-CM

## 2018-08-13 ENCOUNTER — Telehealth (HOSPITAL_COMMUNITY): Payer: Self-pay | Admitting: Licensed Clinical Social Worker

## 2018-08-13 ENCOUNTER — Telehealth (HOSPITAL_COMMUNITY): Payer: Self-pay

## 2018-08-13 NOTE — Telephone Encounter (Signed)
Prior authorization through [employer] company was APPROVED for [employer] and will expire on 07/21/2021.

## 2018-08-13 NOTE — Telephone Encounter (Signed)
CSW received call from pt wife requesting assistance in getting Medicare part D plan as she is going to stop working and they will no longer have supplemental plan through her work.  CSW provided pt with number for [facility] so she could discuss with a medicare counselor and receive assistance with signing up for part D plan based on pt current medication needs  CSW will continue to follow and assist as needed  Burna Sis, LCSW Clinical Social Worker [facility] [phone]

## 2018-08-17 DIAGNOSIS — Z125 Encounter for screening for malignant neoplasm of prostate: Secondary | ICD-10-CM | POA: Diagnosis not present

## 2018-08-17 DIAGNOSIS — E1165 Type 2 diabetes mellitus with hyperglycemia: Secondary | ICD-10-CM | POA: Diagnosis not present

## 2018-08-17 DIAGNOSIS — Z0000 Encounter for general adult medical examination without abnormal findings: Secondary | ICD-10-CM | POA: Diagnosis not present

## 2018-08-17 DIAGNOSIS — E782 Mixed hyperlipidemia: Secondary | ICD-10-CM | POA: Diagnosis not present

## 2018-08-27 ENCOUNTER — Other Ambulatory Visit (HOSPITAL_COMMUNITY): Payer: Self-pay | Admitting: Cardiology

## 2018-08-31 ENCOUNTER — Other Ambulatory Visit (HOSPITAL_COMMUNITY): Payer: Self-pay | Admitting: Cardiology

## 2018-09-18 ENCOUNTER — Encounter (HOSPITAL_COMMUNITY): Payer: BLUE CROSS/BLUE SHIELD | Admitting: Cardiology

## 2018-10-05 ENCOUNTER — Encounter (HOSPITAL_COMMUNITY): Payer: BLUE CROSS/BLUE SHIELD | Admitting: Cardiology

## 2018-10-08 ENCOUNTER — Other Ambulatory Visit (HOSPITAL_COMMUNITY): Payer: Self-pay | Admitting: Internal Medicine

## 2018-10-08 DIAGNOSIS — I5022 Chronic systolic (congestive) heart failure: Secondary | ICD-10-CM

## 2018-10-14 ENCOUNTER — Telehealth (HOSPITAL_COMMUNITY): Payer: Self-pay

## 2018-10-14 ENCOUNTER — Other Ambulatory Visit: Payer: Self-pay

## 2018-10-14 ENCOUNTER — Ambulatory Visit (HOSPITAL_COMMUNITY)
Admission: RE | Admit: 2018-10-14 | Discharge: 2018-10-14 | Disposition: A | Discharge status: Home / Self Care | Payer: BLUE CROSS/BLUE SHIELD | Source: Ambulatory Visit | Attending: Cardiology | Admitting: Cardiology

## 2018-10-14 DIAGNOSIS — I428 Other cardiomyopathies: Secondary | ICD-10-CM

## 2018-10-14 DIAGNOSIS — E119 Type 2 diabetes mellitus without complications: Secondary | ICD-10-CM

## 2018-10-14 DIAGNOSIS — E785 Hyperlipidemia, unspecified: Secondary | ICD-10-CM

## 2018-10-14 DIAGNOSIS — I5022 Chronic systolic (congestive) heart failure: Secondary | ICD-10-CM | POA: Diagnosis not present

## 2018-10-14 NOTE — Telephone Encounter (Signed)
AVS mailed

## 2018-10-14 NOTE — Progress Notes (Signed)
Heart Failure TeleHealth Note  Due to national recommendations of social distancing due to COVID 19, Audio/video telehealth visit is felt to be most appropriate for this patient at this time.  See MyChart message from today for patient consent regarding telehealth for [facility].  Date:  10/14/2018   ID:  [name], DOB 08/29/1950, MRN [id number]  Location: Home  Provider location: [facility] Type of Visit: Established patient  PCP:  Lupita Raider, MD  Cardiologist:  Dr. Shirlee Latch  Chief Complaint: Fatigue   History of Present Illness: [name] is a 68 y.o. male who presents via audio/video conferencing for a telehealth visit today.  We attempted video connection but were unable to connect so used audio only.     he denies symptoms worrisome for COVID 19.   Patient has a history of NICM, chronic systolic heart failure, COPD, and DM2.  He was admitted in 1/16 with chest pain and volume overload. Had an ECHO that showed EF 20-25%. Underwent cath as noted below. He was discharged on low dose bb, lisinopril, digoxin, lasix, and spironolactone.  Cardiac MRI in 8/16 showed EF remaining 31%.  Echo (1/18) with EF 50-55%, mild LV dilation.   He has generally been doing well.  He is in the process of moving to [facility].  He fatigues more easily than in the past but no dyspnea doing his normal activities.  No lightheadedness.  No chest pain. No orthopnea/PND.  Main complaint is joint pain.   CPX 11/08/2014 Peak VO2: 17.8 (70.8% predicted peak VO2)  VE/VCO2 slope: 25.3  OUES: 2.27 Peak RER:  1.00  RHC/LHC 07/18/2014 RA: 5 RV: 29/8/9 PA: 30/11 (21) PCWP: 16 Fick CO/CI: 6.0/3.0 PVR: 0.8 WU SVR: 1042 Ao sat: 94% PA sats: 73%, 74% SVC sat: 72% Large vessel. Wraps apex. Small D1. Large D2. 20-30% plaque in proximal and distal LAD and D2  - ECHO 07/15/14 EF 20-25% Grade III DD - ECHO 10/18/2014 EF 25-30%, diffuse hypokinesis, mildly  decreased RV systolic function with normal RV size.  - Cardiac MRI (8/16) with EF 31%, moderate LVH, normal RV size with mildly decreased systolic function, LGE at the RV insertion sites (nonspecific, suggestive of volume overload).  - Echo 05/01/15 EF 40% with diffuse hypokinesis.  - Echo (1/18): EF 50-55%, mild LV dilation  Labs 07/13/2014 BNP 877 Labs 07/19/2014 K 4.5 Creatinine 1.12 Labs 07/25/2014 Dig level 1.7./   Labs 08/16/2014 K 4.5 Creatinine 1.07  Labs 09/22/2014: K 3.8 creatinine 1.14 Dig level 0.2  Labs 10/26/2014: K 4.2 Creatinine 1.09   Labs 6/16: K 4.6, creatinine 1.26 Labs 12/12/14: K 4.3 Creatinine 1.09 Labs 9/16: K 4.1, Na 129, creatinine 1.09, digoxin 0.3 Labs 10/16: K 4.7, creatinine 1.02, LDL 112, AST 47, ALT 65 Labs 1/17: K 4.5, creatinine 1.17, LDL 29, HDL 23 Labs 2/17: K 5.1, creatinine 1.16, digoxin 0.4 Labs 5/17: digoxin 0.6 Labs 6/17: K 4.8, creatinine 1.35, AST 43, ALT 44 Labs 9/17: K 4.9, creatinine 1.08, digoxin 0.4 Labs 12/17: digoxin 0.4, LDL 37, HDL 27, K 4.6, creatinine 4.91 Labs 4/18: K 5, creatinine 1.5, LDL 49 Labs 2/19: K 4.7, creatinine 1.12, LDL 142, TGs 259, HDL 41  Past Medical History:  Diagnosis Date  . Chronic systolic CHF (congestive heart failure) (HCC)   . COPD (chronic obstructive pulmonary disease) (HCC)   . Diabetes mellitus, type II (HCC)   . Obesity    Past Surgical History:  Procedure Laterality Date  .  LEFT AND RIGHT HEART CATHETERIZATION WITH CORONARY ANGIOGRAM N/A 07/18/2014   Procedure: LEFT AND RIGHT HEART CATHETERIZATION WITH CORONARY ANGIOGRAM;  Surgeon: Dolores Patty, MD;  Location: [facility] CATH LAB;  Service: Cardiovascular;  Laterality: N/A;     Current Outpatient Medications  Medication Sig Dispense Refill  . albuterol (PROVENTIL HFA;VENTOLIN HFA) 108 (90 BASE) MCG/ACT inhaler Inhale 1 puff into the lungs every 6 (six) hours as needed for wheezing or shortness of breath (sometimes allergy). Reported on 07/24/2015    .  carvedilol (COREG) 12.5 MG tablet TAKE 1 TABLET BY MOUTH 2 TIMES DAILY WITH A MEAL. 60 tablet 5  . docusate sodium (COLACE) 100 MG capsule Take 100 mg by mouth 2 (two) times daily as needed for mild constipation. Reported on 10/31/2015    . Empagliflozin-Linagliptin (GLYXAMBI) 25-5 MG TABS Take by mouth.    . furosemide (LASIX) 40 MG tablet Take 40 mg by mouth once a week. prn    . furosemide (LASIX) 40 MG tablet Take 1 tablet (40 mg total) by mouth daily as needed. 60 tablet 6  . glipiZIDE (GLUCOTROL) 10 MG tablet Take 10 mg by mouth 2 (two) times daily before a meal.    . ivabradine (CORLANOR) 5 MG TABS tablet Take 1 tablet (5 mg total) by mouth 2 (two) times daily with a meal. Need follow up appt with Dr Shirlee Latch 60 tablet 3  . lisinopril (ZESTRIL) 5 MG tablet TAKE 1 AND 1/2 TABLETS BY MOUTH 2 TIMES DAILY. 90 tablet 0  . loperamide (IMODIUM A-D) 2 MG tablet Take 2 mg by mouth 4 (four) times daily as needed for diarrhea or loose stools.    . metFORMIN (GLUCOPHAGE) 500 MG tablet Take 1,000 mg by mouth 2 (two) times daily with a meal.     . pantoprazole (PROTONIX) 40 MG tablet TAKE 1 TABLET (40 MG TOTAL) BY MOUTH DAILY. 30 tablet 5  . QC LO-DOSE ASPIRIN 81 MG EC tablet TAKE 1 TABLET BY MOUTH DAILY. 30 tablet 11  . rosuvastatin (CRESTOR) 20 MG tablet Take 20 mg by mouth daily.    . simethicone (MYLICON) 80 MG chewable tablet Chew 1 tablet (80 mg total) by mouth every 6 (six) hours as needed for flatulence. 30 tablet 0  . spironolactone (ALDACTONE) 25 MG tablet TAKE 1 TABLET BY MOUTH EVERY EVENING. 30 tablet 3   No current facility-administered medications for this encounter.     Allergies:   Iodine solution [povidone iodine]   Social History:  The patient  reports that he has quit smoking. He started smoking about 7 years ago. He has never used smokeless tobacco. He reports current alcohol use. He reports that he does not use drugs.   Family History:  The patient's family history includes Diabetes  in his father.   ROS:  Please see the history of present illness.   All other systems are personally reviewed and negative.   Exam:  (Video/Tele Health Call; Exam is subjective and or/visual.) BP 115/70 (measured by patient) General:  Speaks in full sentences. No resp difficulty. Lungs: Normal respiratory effort with conversation.  Abdomen: Non-distended per patient report Extremities: Pt denies edema. Neuro: Alert & oriented x 3.   Recent Labs: No results found for requested labs within last 8760 hours.  Personally reviewed   Wt Readings from Last 3 Encounters:  08/07/17 102.7 kg (226 lb 6.4 oz)  01/16/17 100.6 kg (221 lb 12.8 oz)  05/27/16 99.8 kg (220 lb)  ASSESSMENT AND PLAN:  1. 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  Had cath 07/18/2014 without significant coronary disease. ECHO 06/2014 EF 20-25% => ECHO 5/16 EF 25-30%.  Cardiac MRI in 8/16 with EF 31%, RV insertion site LGE pattern, nonspecific.  Echo in 11/16 showed some improvement in EF to 40%.  Finally, echo in 1/18 showed EF up to 50-55%. NYHA class II symptoms.  Weight has been stable.  - Continue Lasix prn.  - Continue spironolactone 25 mg daily.   - Continue Coreg 12.5 mg bid and lisinopril 7.5 mg bid.    - Continue Corlanor 5 mg twice a day.    - I will arrange for echo to make sure EF is staying up, he can get this in 3 months when he returns to the office.   - He reports recent labs at PCP's office, will call for copy of the BMET.  2.  Hyperlipidemia: Continue Crestor, will call PCP for copy of recent lipid panel.   3. Type II diabetes: He is on empagliflozin.   COVID screen The patient does not have any symptoms that suggest any further testing/ screening at this time.  Social distancing reinforced today.  Patient Risk: After full review of this patients clinical status, I feel that they are at moderate risk for cardiac decompensation at this time.  Relevant  cardiac medications were reviewed at length with the patient today. The patient does not have concerns regarding their medications at this time.   Recommended follow-up:  3 months with echo  Today, I have spent 16 minutes with the patient with telehealth technology discussing the above issues .    Signed, Marca Ancona, MD  10/14/2018 10:43 PM  [facility] [address] [facility] [place] [address] [phone] (office) [phone] (fax)

## 2018-10-14 NOTE — Patient Instructions (Signed)
Your physician has requested that you have an echocardiogram. Echocardiography is a painless test that uses sound waves to create images of your heart. It provides your doctor with information about the size and shape of your heart and how well your heart's chambers and valves are working. This procedure takes approximately one hour. There are no restrictions for this procedure.  Your physician recommends that you schedule a follow-up appointment in: 3 months, we will call to schedule this and the ECHO on the same day.  Please get labs done here at the office on 6 May @ 10:30am.

## 2018-10-19 ENCOUNTER — Other Ambulatory Visit (HOSPITAL_COMMUNITY): Payer: Self-pay | Admitting: Vascular Surgery

## 2018-10-21 ENCOUNTER — Other Ambulatory Visit (HOSPITAL_COMMUNITY): Payer: BLUE CROSS/BLUE SHIELD

## 2018-10-23 ENCOUNTER — Other Ambulatory Visit (HOSPITAL_COMMUNITY): Payer: Self-pay | Admitting: Internal Medicine

## 2018-11-19 DIAGNOSIS — I509 Heart failure, unspecified: Secondary | ICD-10-CM | POA: Diagnosis not present

## 2018-11-19 DIAGNOSIS — J449 Chronic obstructive pulmonary disease, unspecified: Secondary | ICD-10-CM | POA: Diagnosis not present

## 2018-11-19 DIAGNOSIS — R809 Proteinuria, unspecified: Secondary | ICD-10-CM | POA: Diagnosis not present

## 2018-11-19 DIAGNOSIS — E1165 Type 2 diabetes mellitus with hyperglycemia: Secondary | ICD-10-CM | POA: Diagnosis not present

## 2018-11-30 ENCOUNTER — Other Ambulatory Visit (HOSPITAL_COMMUNITY): Payer: Self-pay | Admitting: Cardiology

## 2018-12-12 ENCOUNTER — Other Ambulatory Visit (HOSPITAL_COMMUNITY): Payer: Self-pay | Admitting: Internal Medicine

## 2018-12-12 DIAGNOSIS — I5022 Chronic systolic (congestive) heart failure: Secondary | ICD-10-CM

## 2018-12-14 ENCOUNTER — Other Ambulatory Visit (HOSPITAL_COMMUNITY): Payer: Self-pay | Admitting: *Deleted

## 2018-12-14 DIAGNOSIS — I5022 Chronic systolic (congestive) heart failure: Secondary | ICD-10-CM

## 2018-12-14 MED ORDER — LISINOPRIL 5 MG PO TABS: ORAL_TABLET | ORAL | 3 refills | Status: DC

## 2019-01-18 ENCOUNTER — Telehealth (HOSPITAL_COMMUNITY): Payer: Self-pay | Admitting: Cardiology

## 2019-01-18 NOTE — Telephone Encounter (Signed)
Per Karis Juba in [facility], patient/family called on Friday afternoon and asked that she have [place] cancel the appt for an Echo and f/u w/Dr. Aundra Dubin for 01/19/2019.  I cancelled appts and called pt to try to reschedule.  No answer.  Left message asking pt to call the clinic to reschedule his appts.

## 2019-01-19 ENCOUNTER — Encounter (HOSPITAL_COMMUNITY): Payer: BLUE CROSS/BLUE SHIELD | Admitting: Cardiology

## 2019-01-19 ENCOUNTER — Ambulatory Visit (HOSPITAL_COMMUNITY): Payer: BC Managed Care – PPO

## 2019-03-02 ENCOUNTER — Other Ambulatory Visit (HOSPITAL_COMMUNITY): Payer: Self-pay | Admitting: Cardiology

## 2019-03-25 ENCOUNTER — Other Ambulatory Visit (HOSPITAL_COMMUNITY): Payer: Self-pay | Admitting: Cardiology

## 2019-04-24 ENCOUNTER — Other Ambulatory Visit (HOSPITAL_COMMUNITY): Payer: Self-pay | Admitting: Internal Medicine

## 2019-05-25 ENCOUNTER — Other Ambulatory Visit (HOSPITAL_COMMUNITY): Payer: Self-pay | Admitting: Cardiology

## 2019-06-08 ENCOUNTER — Other Ambulatory Visit (HOSPITAL_COMMUNITY): Payer: Self-pay | Admitting: Internal Medicine

## 2019-06-08 DIAGNOSIS — I5022 Chronic systolic (congestive) heart failure: Secondary | ICD-10-CM

## 2019-06-21 ENCOUNTER — Other Ambulatory Visit (HOSPITAL_COMMUNITY): Payer: Self-pay | Admitting: Internal Medicine

## 2019-06-28 ENCOUNTER — Other Ambulatory Visit (HOSPITAL_COMMUNITY): Payer: Self-pay

## 2019-06-28 ENCOUNTER — Telehealth (HOSPITAL_COMMUNITY): Payer: Self-pay

## 2019-06-28 MED ORDER — IVABRADINE HCL 5 MG PO TABS: 5.0000 mg | ORAL_TABLET | Freq: Two times a day (BID) | ORAL | 0 refills | Status: DC

## 2019-06-28 NOTE — Telephone Encounter (Signed)
Called and spoke with Pt advising we need to schedule an echo and follow up with Dr. Shirlee Latch.  He advised that he does not want to run the risk of being exposed to COVID-19 so he will wait until he gets the vaccine and then call the office to schedule appts.

## 2019-07-14 ENCOUNTER — Other Ambulatory Visit (HOSPITAL_COMMUNITY): Payer: Self-pay

## 2019-07-14 MED ORDER — IVABRADINE HCL 5 MG PO TABS: 5.0000 mg | ORAL_TABLET | Freq: Two times a day (BID) | ORAL | 1 refills | Status: DC

## 2019-07-25 ENCOUNTER — Other Ambulatory Visit (HOSPITAL_COMMUNITY): Payer: Self-pay | Admitting: Internal Medicine

## 2019-07-26 ENCOUNTER — Other Ambulatory Visit (HOSPITAL_COMMUNITY): Payer: Self-pay | Admitting: Internal Medicine

## 2019-07-29 ENCOUNTER — Other Ambulatory Visit (HOSPITAL_COMMUNITY): Payer: Self-pay | Admitting: Cardiology

## 2019-08-20 DIAGNOSIS — Z23 Encounter for immunization: Secondary | ICD-10-CM | POA: Diagnosis not present

## 2019-08-30 ENCOUNTER — Other Ambulatory Visit: Payer: Self-pay

## 2019-08-30 ENCOUNTER — Ambulatory Visit (HOSPITAL_COMMUNITY)
Admission: RE | Admit: 2019-08-30 | Discharge: 2019-08-30 | Disposition: A | Discharge status: Home / Self Care | Payer: BC Managed Care – PPO | Source: Ambulatory Visit | Attending: Cardiology | Admitting: Cardiology

## 2019-08-30 DIAGNOSIS — I340 Nonrheumatic mitral (valve) insufficiency: Secondary | ICD-10-CM | POA: Insufficient documentation

## 2019-08-30 DIAGNOSIS — J449 Chronic obstructive pulmonary disease, unspecified: Secondary | ICD-10-CM | POA: Diagnosis not present

## 2019-08-30 DIAGNOSIS — Z125 Encounter for screening for malignant neoplasm of prostate: Secondary | ICD-10-CM | POA: Diagnosis not present

## 2019-08-30 DIAGNOSIS — I509 Heart failure, unspecified: Secondary | ICD-10-CM | POA: Diagnosis not present

## 2019-08-30 DIAGNOSIS — E119 Type 2 diabetes mellitus without complications: Secondary | ICD-10-CM | POA: Insufficient documentation

## 2019-08-30 DIAGNOSIS — Z0000 Encounter for general adult medical examination without abnormal findings: Secondary | ICD-10-CM | POA: Diagnosis not present

## 2019-08-30 DIAGNOSIS — I5022 Chronic systolic (congestive) heart failure: Secondary | ICD-10-CM | POA: Insufficient documentation

## 2019-08-30 DIAGNOSIS — E1165 Type 2 diabetes mellitus with hyperglycemia: Secondary | ICD-10-CM | POA: Diagnosis not present

## 2019-08-30 DIAGNOSIS — E782 Mixed hyperlipidemia: Secondary | ICD-10-CM | POA: Diagnosis not present

## 2019-08-30 DIAGNOSIS — E785 Hyperlipidemia, unspecified: Secondary | ICD-10-CM | POA: Insufficient documentation

## 2019-08-30 DIAGNOSIS — N183 Chronic kidney disease, stage 3 unspecified: Secondary | ICD-10-CM | POA: Diagnosis not present

## 2019-08-30 NOTE — Progress Notes (Signed)
  Echocardiogram 2D Echocardiogram has been performed.  Gerda Diss 08/30/2019, 10:45 AM

## 2019-09-07 ENCOUNTER — Ambulatory Visit (HOSPITAL_COMMUNITY)
Admission: RE | Admit: 2019-09-07 | Discharge: 2019-09-07 | Disposition: A | Discharge status: Home / Self Care | Payer: BC Managed Care – PPO | Source: Ambulatory Visit | Attending: Cardiology | Admitting: Cardiology

## 2019-09-07 ENCOUNTER — Other Ambulatory Visit: Payer: Self-pay

## 2019-09-07 DIAGNOSIS — E119 Type 2 diabetes mellitus without complications: Secondary | ICD-10-CM | POA: Diagnosis not present

## 2019-09-07 DIAGNOSIS — I428 Other cardiomyopathies: Secondary | ICD-10-CM | POA: Diagnosis not present

## 2019-09-07 DIAGNOSIS — E785 Hyperlipidemia, unspecified: Secondary | ICD-10-CM

## 2019-09-07 DIAGNOSIS — I5022 Chronic systolic (congestive) heart failure: Secondary | ICD-10-CM

## 2019-09-07 MED ORDER — SACUBITRIL-VALSARTAN 24-26 MG PO TABS: 1.0000 | ORAL_TABLET | Freq: Two times a day (BID) | ORAL | 1 refills | Status: DC

## 2019-09-07 MED ORDER — ROSUVASTATIN CALCIUM 20 MG PO TABS: 20.0000 mg | ORAL_TABLET | Freq: Every day | ORAL | 2 refills | Status: DC

## 2019-09-07 NOTE — Progress Notes (Signed)
Spoke with patient, reviewed AVS.  AVS mailed along with lab prescription and appt card. Pt verbalized understanding.

## 2019-09-07 NOTE — Progress Notes (Signed)
Heart Failure TeleHealth Note  Due to national recommendations of social distancing due to COVID 19, Audio/video telehealth visit is felt to be most appropriate for this patient at this time.  See MyChart message from today for patient consent regarding telehealth for [facility].  Date:  09/07/2019   ID:  [name], DOB 01-03-1951, MRN [id number]  Location: Home  Provider location: [place] [facility] Type of Visit: Established patient  PCP:  Lupita Raider, MD  Cardiologist:  Dr. Shirlee Latch  Chief Complaint: Fatigue   History of Present Illness: [name] is a 69 y.o. male who presents via audio/video conferencing for a telehealth visit today.    he denies symptoms worrisome for COVID 19.   Patient has a history of NICM, chronic systolic heart failure, COPD, and DM2.  He was admitted in 1/16 with chest pain and volume overload. He had an ECHO in 2016 that showed EF 20-25%. Underwent cath as noted below. He was discharged on low dose bb, lisinopril, digoxin, lasix, and spironolactone. Cardiac MRI in 8/16 showed EF remaining 31%.  Echo (1/18) with EF 50-55%, mild LV dilation.   Echo in 3/21 showed EF a bit lower than last study, 40-45% with severe LVH, normal RV, mild MR, normal IVC.   He now lives on [place].  He is using Lasix only rarely.  He denies chest pain.  He fatigues easier than in the past but does not get significant short of breath.  Poor stamina.  Occasional wheezing.   CPX 11/08/2014 Peak VO2: 17.8 (70.8% predicted peak VO2)  VE/VCO2 slope: 25.3  OUES: 2.27 Peak RER:  1.00  RHC/LHC 07/18/2014 RA: 5 RV: 29/8/9 PA: 30/11 (21) PCWP: 16 Fick CO/CI: 6.0/3.0 PVR: 0.8 WU SVR: 1042 Ao sat: 94% PA sats: 73%, 74% SVC sat: 72% Large vessel. Wraps apex. Small D1. Large D2. 20-30% plaque in proximal and distal LAD and D2  - ECHO 07/15/14 EF 20-25% Grade III DD - ECHO 10/18/2014 EF 25-30%, diffuse hypokinesis, mildly decreased RV  systolic function with normal RV size.  - Cardiac MRI (8/16) with EF 31%, moderate LVH, normal RV size with mildly decreased systolic function, LGE at the RV insertion sites (nonspecific, suggestive of volume overload).  - Echo 05/01/15 EF 40% with diffuse hypokinesis.  - Echo (1/18): EF 50-55%, mild LV dilation - Echo (3/21): EF 40-45%, basal to mid anteroseptal AK, severe LVH, normal RV size and systolic function, mild MR, IVC normal.   Labs 07/13/2014 BNP 877 Labs 07/19/2014 K 4.5 Creatinine 1.12 Labs 07/25/2014 Dig level 1.7./   Labs 08/16/2014 K 4.5 Creatinine 1.07  Labs 09/22/2014: K 3.8 creatinine 1.14 Dig level 0.2  Labs 10/26/2014: K 4.2 Creatinine 1.09   Labs 6/16: K 4.6, creatinine 1.26 Labs 12/12/14: K 4.3 Creatinine 1.09 Labs 9/16: K 4.1, Na 129, creatinine 1.09, digoxin 0.3 Labs 10/16: K 4.7, creatinine 1.02, LDL 112, AST 47, ALT 65 Labs 1/17: K 4.5, creatinine 1.17, LDL 29, HDL 23 Labs 2/17: K 5.1, creatinine 1.16, digoxin 0.4 Labs 5/17: digoxin 0.6 Labs 6/17: K 4.8, creatinine 1.35, AST 43, ALT 44 Labs 9/17: K 4.9, creatinine 1.08, digoxin 0.4 Labs 12/17: digoxin 0.4, LDL 37, HDL 27, K 4.6, creatinine 2.02 Labs 4/18: K 5, creatinine 1.5, LDL 49 Labs 2/19: K 4.7, creatinine 1.12, LDL 142, TGs 259, HDL 41 Labs 3/21: LFTs normal, LDL 115, K 4.6, creatinine 1.14  Past Medical History:  Diagnosis Date  . Chronic systolic CHF (  congestive heart failure) ([place])   . COPD (chronic obstructive pulmonary disease) ([place])   . Diabetes mellitus, type II ([place])   . Obesity    Past Surgical History:  Procedure Laterality Date  . LEFT AND RIGHT HEART CATHETERIZATION WITH CORONARY ANGIOGRAM N/A 07/18/2014   Procedure: LEFT AND RIGHT HEART CATHETERIZATION WITH CORONARY ANGIOGRAM;  Surgeon: Jolaine Artist, MD;  Location: [facility] CATH LAB;  Service: Cardiovascular;  Laterality: N/A;     Current Outpatient Medications  Medication Sig Dispense Refill  . albuterol (PROVENTIL HFA;VENTOLIN HFA) 108  (90 BASE) MCG/ACT inhaler Inhale 1 puff into the lungs every 6 (six) hours as needed for wheezing or shortness of breath (sometimes allergy). Reported on 07/24/2015    . carvedilol (COREG) 12.5 MG tablet TAKE 1 TABLET BY MOUTH TWICE A DAY WITH MEAL 180 tablet 3  . docusate sodium (COLACE) 100 MG capsule Take 100 mg by mouth 2 (two) times daily as needed for mild constipation. Reported on 10/31/2015    . Empagliflozin-Linagliptin (GLYXAMBI) 25-5 MG TABS Take by mouth.    . furosemide (LASIX) 40 MG tablet Take 40 mg by mouth once a week. prn    . furosemide (LASIX) 40 MG tablet Take 1 tablet (40 mg total) by mouth daily as needed. 60 tablet 6  . glipiZIDE (GLUCOTROL) 10 MG tablet Take 10 mg by mouth 2 (two) times daily before a meal.    . ivabradine (CORLANOR) 5 MG TABS tablet Take 1 tablet (5 mg total) by mouth 2 (two) times daily with a meal. 180 tablet 1  . loperamide (IMODIUM A-D) 2 MG tablet Take 2 mg by mouth 4 (four) times daily as needed for diarrhea or loose stools.    . metFORMIN (GLUCOPHAGE) 500 MG tablet Take 1,000 mg by mouth 2 (two) times daily with a meal.     . pantoprazole (PROTONIX) 40 MG tablet TAKE 1 TABLET (40 MG TOTAL) BY MOUTH DAILY. 30 tablet 5  . QC LO-DOSE ASPIRIN 81 MG EC tablet TAKE 1 TABLET BY MOUTH DAILY. 30 tablet 11  . rosuvastatin (CRESTOR) 20 MG tablet Take 1 tablet (20 mg total) by mouth daily. 90 tablet 2  . sacubitril-valsartan (ENTRESTO) 24-26 MG Take 1 tablet by mouth 2 (two) times daily. 180 tablet 1  . simethicone (MYLICON) 80 MG chewable tablet Chew 1 tablet (80 mg total) by mouth every 6 (six) hours as needed for flatulence. 30 tablet 0  . spironolactone (ALDACTONE) 25 MG tablet TAKE 1 TABLET BY MOUTH DAILY IN THE EVENING 30 tablet 0   No current facility-administered medications for this encounter.    Allergies:   Iodine solution [povidone iodine]   Social History:  The patient  reports that he has quit smoking. He started smoking about 8 years ago. He has  never used smokeless tobacco. He reports current alcohol use. He reports that he does not use drugs.   Family History:  The patient's family history includes Diabetes in his father.   ROS:  Please see the history of present illness.   All other systems are personally reviewed and negative.   Exam:  (Video/Tele Health Call; Exam is subjective and or/visual.) General:  Speaks in full sentences. No resp difficulty. Lungs: Normal respiratory effort with conversation.  Abdomen: Non-distended per patient report Extremities: Pt denies edema. Neuro: Alert & oriented x 3.   Recent Labs: No results found for requested labs within last 8760 hours.  Personally reviewed   Wt Readings from Last 3 Encounters:  08/07/17 102.7 kg (226 lb 6.4 oz)  01/16/17 100.6 kg (221 lb 12.8 oz)  05/27/16 99.8 kg (220 lb)      ASSESSMENT AND PLAN:  1. 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  Had cath 07/18/2014 without significant coronary disease. ECHO 06/2014 EF 20-25% => ECHO 5/16 EF 25-30%.  Cardiac MRI in 8/16 with EF 31%, RV insertion site LGE pattern, nonspecific.  Echo in 11/16 showed some improvement in EF to 40%.  Echo in 1/18 showed EF up to 50-55%.  Most recent echo in 3/21 showed EF down to 40-45%, severe LVH.  NYHA class II symptoms.  Weight has been stable.  - Continue Lasix prn.  - Continue spironolactone 25 mg daily.   - Continue Coreg 12.5 mg bid.  - With lower EF, I will have him stop lisinopril and in 36 hrs start Entresto 24/26 bid. BMET 10 days.    - Continue Corlanor 5 mg twice a day.    - Cardiac amyloidosis is a concern with severe LVH, consider PYP scan in future.  If he has any chest pain, he will need a Cardiolite.  2.  Hyperlipidemia: He has been off Crestor, I will have him restart at prior dose of 20 mg daily.  Lipids/LFTs in 2 months.    3. Type II diabetes: He is on empagliflozin.   COVID screen The patient does not have any symptoms that  suggest any further testing/ screening at this time.  Social distancing reinforced today.  Patient Risk: After full review of this patients clinical status, I feel that they are at moderate risk for cardiac decompensation at this time.  Relevant cardiac medications were reviewed at length with the patient today. The patient does not have concerns regarding their medications at this time.   Recommended follow-up:  3 months  Today, I have spent 17 minutes with the patient with telehealth technology discussing the above issues .    Signed, Marca Ancona, MD  09/07/2019  [facility] [address] [facility] [place] [address] [phone] (office) [phone] (fax)

## 2019-09-07 NOTE — Patient Instructions (Addendum)
START Crestor 20mg  (1 tab) daily   STOP Lisinopril   START Entresto 24/26mg  (1 tab) twice a day. START on 09/09/19   Labs 10 days after starting Entresto.  You were sent a prescription to have this done locally. Fax results to [phone] We will only contact you if something comes back abnormal or we need to make some changes. Otherwise no news is good news!   Your physician recommends that you schedule a follow-up appointment in: 3 months. Appointment card mailed.    Please call office at [phone] option [phone] if you have any questions or concerns.   At the [facility], you and your health needs are our priority. As part of our continuing mission to provide you with exceptional heart care, we have created designated Provider Care Teams. These Care Teams include your primary Cardiologist (physician) and Advanced Practice Providers (APPs- Physician Assistants and Nurse Practitioners) who all work together to provide you with the care you need, when you need it.   You may see any of the following providers on your designated Care Team at your next follow up: [phone] Dr Marland Kitchen . Dr Arvilla Meres . Marca Ancona, NP . Tonye Becket, PA . Robbie Lis, PharmD   Please be sure to bring in all your medications bottles to every appointment.

## 2019-09-09 ENCOUNTER — Other Ambulatory Visit (HOSPITAL_COMMUNITY): Payer: Self-pay | Admitting: Cardiology

## 2019-09-09 ENCOUNTER — Other Ambulatory Visit (HOSPITAL_COMMUNITY): Payer: Self-pay | Admitting: *Deleted

## 2019-09-16 DIAGNOSIS — Z23 Encounter for immunization: Secondary | ICD-10-CM | POA: Diagnosis not present

## 2019-12-13 ENCOUNTER — Encounter (HOSPITAL_COMMUNITY): Payer: BC Managed Care – PPO | Admitting: Cardiology

## 2019-12-24 ENCOUNTER — Telehealth (HOSPITAL_COMMUNITY): Payer: Self-pay | Admitting: Pharmacy Technician

## 2019-12-24 NOTE — Telephone Encounter (Signed)
Started an application for [facility] patient assistance for [employer]. Will mail application to patient to sign.  Will follow up.

## 2019-12-28 NOTE — Telephone Encounter (Signed)
Spoke with patient's wife and cleared up a few questions she had about his application. She is going to send it back in the mail today.  Will follow up.

## 2020-01-03 NOTE — Telephone Encounter (Signed)
Sent application in via fax.  Will follow up.

## 2020-01-06 NOTE — Telephone Encounter (Signed)
Received a message from [facility] saying that there was a PTAN number missing from the providers application. This information has not been required in the past to have on the application.  Called and spoke with a representative for [employer]. She has sent a message to have someone call me back.  Will follow up.

## 2020-01-11 NOTE — Telephone Encounter (Signed)
I never received a call back from [facility]. Went ahead and called [employer] today. Upon checking, the representative I spoke with realized that they do not in fact need the provider's PTAN number. She went ahead and placed it back into the benefits investigation queue. I requested for the application to be expedited.  Will follow up.

## 2020-01-17 NOTE — Telephone Encounter (Signed)
Advanced Heart Failure Patient Advocate Encounter   Patient was approved to receive Corlanor from [facility].  Patient ID: [id number] Effective dates: 01/14/20 through 06/16/20  Representative stated that the patient needed to call in to get the medication shipped to him. Called and left the patient a message with the phone number to [facility].  Will try to reach out to the patient again.

## 2020-01-27 NOTE — Telephone Encounter (Signed)
Called and left another message for the patient to follow up with me.  Archer Asa, CPhT

## 2020-04-24 ENCOUNTER — Telehealth (HOSPITAL_COMMUNITY): Payer: Self-pay | Admitting: Pharmacy Technician

## 2020-04-24 NOTE — Telephone Encounter (Signed)
Spoke with patient's wife today. She is going to send in the patient's [employer] enrollment application for 2022 to the office.   Will fax in once received and provider has signed.

## 2020-05-03 NOTE — Telephone Encounter (Signed)
Received completed application, will fax in December 1st during the re-enrollment period.

## 2020-06-06 ENCOUNTER — Encounter (HOSPITAL_COMMUNITY): Payer: Self-pay

## 2020-06-06 ENCOUNTER — Telehealth (HOSPITAL_COMMUNITY): Payer: Self-pay | Admitting: Cardiology

## 2020-06-06 NOTE — Telephone Encounter (Signed)
Patient's wife called in requesting samples while we wait on a determination from [place]. Marchelle [employer]) is going to have samples available for him.

## 2020-06-06 NOTE — Telephone Encounter (Signed)
Steward Drone (pt wife) wants to know if he can get samples of Corlanor, until the pharmacy delivers medication, they will be in the area on Thursday, she can be reached @[phone], Thanks

## 2020-06-06 NOTE — Progress Notes (Signed)
Medication Samples have been provided to the patient.  Drug name: Corlanor       Strength: 5mg         Qty: 4 bottles (14 tablets each)  LOT:  Exp.Date: 07/25  Dosing instructions: 1 tablet twice daily  The patient has been instructed regarding the correct time, dose, and frequency of taking this medication, including desired effects and most common side effects.   8/25 9:44 AM 06/06/2020

## 2020-06-08 ENCOUNTER — Telehealth (HOSPITAL_COMMUNITY): Payer: Self-pay | Admitting: Pharmacy Technician

## 2020-06-08 NOTE — Telephone Encounter (Signed)
Sent in [place] application via fax.  Will follow up.

## 2020-07-12 NOTE — Telephone Encounter (Signed)
Advanced Heart Failure Patient Advocate Encounter   Patient was approved to receive Corlanor from [employer]  Patient ID: [id number] Effective dates: 06/19/20 through 06/16/21  [name], CPhT

## 2020-09-01 ENCOUNTER — Telehealth (HOSPITAL_COMMUNITY): Payer: Self-pay

## 2020-09-01 NOTE — Telephone Encounter (Signed)
Received a fax requesting medical records from [employer]. Records were successfully faxed to: [phone] ,which was the number provided.. Medical request form will be scanned into patients chart.

## 2020-11-17 ENCOUNTER — Other Ambulatory Visit (HOSPITAL_COMMUNITY): Payer: Self-pay | Admitting: *Deleted

## 2020-11-17 MED ORDER — IVABRADINE HCL 5 MG PO TABS: 5.0000 mg | ORAL_TABLET | Freq: Two times a day (BID) | ORAL | 1 refills | Status: DC

## 2020-11-21 ENCOUNTER — Other Ambulatory Visit (HOSPITAL_COMMUNITY): Payer: Self-pay | Admitting: *Deleted

## 2020-11-21 ENCOUNTER — Other Ambulatory Visit (HOSPITAL_COMMUNITY): Payer: Self-pay

## 2020-11-21 MED ORDER — IVABRADINE HCL 5 MG PO TABS: 5.0000 mg | ORAL_TABLET | Freq: Two times a day (BID) | ORAL | 3 refills | Status: DC

## 2020-11-21 NOTE — Telephone Encounter (Signed)
Refill faxed to [employer] [phone] per pts request.

## 2020-11-30 ENCOUNTER — Other Ambulatory Visit (HOSPITAL_COMMUNITY): Payer: Self-pay | Admitting: *Deleted

## 2020-11-30 ENCOUNTER — Other Ambulatory Visit (HOSPITAL_COMMUNITY): Payer: Self-pay

## 2020-11-30 MED ORDER — IVABRADINE HCL 5 MG PO TABS: 5.0000 mg | ORAL_TABLET | Freq: Two times a day (BID) | ORAL | 3 refills | Status: DC

## 2021-03-15 ENCOUNTER — Telehealth (HOSPITAL_COMMUNITY): Payer: Self-pay | Admitting: Pharmacy Technician

## 2021-03-15 NOTE — Telephone Encounter (Signed)
Advanced Heart Failure Patient Advocate Encounter  Patient's wife called in stating that she received the re-enrollment application for Corlanor for the patient. She advised me that she will fill in their portion and send the application to the office to complete and return to [facility].

## 2021-04-20 ENCOUNTER — Other Ambulatory Visit (HOSPITAL_COMMUNITY): Payer: Self-pay

## 2021-04-20 NOTE — Telephone Encounter (Signed)
Sent in application via fax.  Will follow up.  

## 2021-04-30 NOTE — Telephone Encounter (Signed)
Advanced Heart Failure Patient Advocate Encounter   Patient was approved to receive Corlanor from [facility]  Patient ID: [id number] Effective dates: 04/27/21 through 06/16/22  Called and left the patient a message.   Archer Asa, CPhT

## 2021-12-13 ENCOUNTER — Other Ambulatory Visit (HOSPITAL_COMMUNITY): Payer: Self-pay | Admitting: *Deleted

## 2021-12-13 MED ORDER — IVABRADINE HCL 5 MG PO TABS: 5.0000 mg | ORAL_TABLET | Freq: Two times a day (BID) | ORAL | 3 refills | Status: DC

## 2021-12-25 ENCOUNTER — Other Ambulatory Visit (HOSPITAL_COMMUNITY): Payer: Self-pay | Admitting: *Deleted

## 2021-12-25 ENCOUNTER — Telehealth (HOSPITAL_COMMUNITY): Payer: Self-pay | Admitting: Cardiology

## 2021-12-25 NOTE — Telephone Encounter (Signed)
Pt request refill for Corlanor need prior approval, fax was sent in June,  pt is out of medication , please call [phone] Steward Drone)

## 2022-01-02 ENCOUNTER — Other Ambulatory Visit (HOSPITAL_COMMUNITY): Payer: Self-pay | Admitting: *Deleted

## 2022-01-02 MED ORDER — IVABRADINE HCL 5 MG PO TABS: 5.0000 mg | ORAL_TABLET | Freq: Two times a day (BID) | ORAL | 3 refills | Status: DC

## 2022-01-03 ENCOUNTER — Telehealth (HOSPITAL_COMMUNITY): Payer: Self-pay | Admitting: Pharmacy Technician

## 2022-01-03 ENCOUNTER — Telehealth (HOSPITAL_COMMUNITY): Payer: Self-pay | Admitting: Cardiology

## 2022-01-03 ENCOUNTER — Other Ambulatory Visit (HOSPITAL_COMMUNITY): Payer: Self-pay

## 2022-01-03 NOTE — Telephone Encounter (Signed)
Patient Advocate Encounter   Received notification from [facility] Part D that prior authorization for Corlanor is required.   PA submitted on CoverMyMeds Key [id number] Status is pending   Will continue to follow.  Called and spoke with the patient's wife. She stated that [employer] needed a new RX not a prior authorization approval. She stated that the patient is not out of medication but getting low. I explained that an eRX was sent to [facility] yesterday.   The patient has not been seen in our office since 2021 and would need an appointment for [place] to continue assisting him. The patient's wife stated that they live in [place] at the moment and wondered how that would work. I explained that they could ask for a telephone visit but that would be up to the provider and eventually he would need to be seen physically for things that cannot be done over the phone or virtually. Suggested that they could also establish care with a cardiologist where they are.  She is aware that we will not renew his assistance without further office visits.   Archer Asa, CPhT

## 2022-01-03 NOTE — Telephone Encounter (Signed)
Pt request refill for Corlanor need prior approval, fax was sent in June,  pt is out of medication , please call [phone] Steward Drone),

## 2022-01-04 ENCOUNTER — Other Ambulatory Visit (HOSPITAL_COMMUNITY): Payer: Self-pay

## 2022-01-04 NOTE — Telephone Encounter (Signed)
Advanced Heart Failure Patient Advocate Encounter  Prior Authorization for Corlanor has been approved.    PA# [id number] Effective dates: 01/03/22 through 01/04/23  Patients co-pay is $143.58 (30 days)  Archer Asa, CPhT

## 2022-01-07 ENCOUNTER — Encounter (HOSPITAL_COMMUNITY): Payer: BC Managed Care – PPO | Admitting: Cardiology

## 2024-06-14 ENCOUNTER — Other Ambulatory Visit (HOSPITAL_COMMUNITY): Payer: Self-pay

## 2024-06-14 ENCOUNTER — Other Ambulatory Visit (HOSPITAL_COMMUNITY): Payer: Self-pay | Admitting: *Deleted

## 2024-06-14 MED ORDER — IVABRADINE HCL 5 MG PO TABS: 5.0000 mg | ORAL_TABLET | Freq: Two times a day (BID) | ORAL | 3 refills | Status: DC

## 2024-06-14 NOTE — Telephone Encounter (Signed)
 Pt sch for f/u 07/07/24 with Dr Rolan, is trying to get pt assistance renewed for Corlanor however they were told by [employer] it is no longer provided.  Per pharmacy team, cost is $45 for 30 days or $135 for 90 days, pt's wife states they can afford that. Refill sent in per Dr Rolan

## 2024-06-15 ENCOUNTER — Telehealth (HOSPITAL_COMMUNITY): Payer: Self-pay

## 2024-06-15 NOTE — Telephone Encounter (Signed)
 Advanced Heart Failure Patient Advocate Encounter  Test billing for this patient's current coverage [facility]) returns a $45 copay for 30 day supply of Ivabradine .  This test claim was processed through [facility]- copay amounts may vary at other pharmacies due to pharmacy/plan contracts, or as the patient moves through the different stages of their insurance plan.  Rachel DEL, CPhT Rx Patient Advocate Phone: [phone]

## 2024-07-05 ENCOUNTER — Telehealth (HOSPITAL_COMMUNITY): Payer: Self-pay | Admitting: Cardiology

## 2024-07-05 NOTE — Telephone Encounter (Signed)
 Called to confirm/remind patient of their appointment at the [facility] on 07/05/2024.   Appointment:   [] Confirmed  [x] Left mess   [] No answer/No voice mail  [] VM Full/unable to leave message  [] Phone not in service  Patient reminded to bring all medications and/or complete list.  Confirmed patient has transportation. Gave directions, instructed to utilize valet parking.

## 2024-07-06 ENCOUNTER — Encounter (HOSPITAL_COMMUNITY): Payer: Self-pay | Admitting: Cardiology

## 2024-07-06 ENCOUNTER — Ambulatory Visit (HOSPITAL_COMMUNITY)
Admission: RE | Admit: 2024-07-06 | Discharge: 2024-07-06 | Disposition: A | Source: Ambulatory Visit | Attending: Cardiology | Admitting: Cardiology

## 2024-07-06 ENCOUNTER — Ambulatory Visit (HOSPITAL_COMMUNITY): Payer: Self-pay | Admitting: Cardiology

## 2024-07-06 ENCOUNTER — Ambulatory Visit (HOSPITAL_COMMUNITY)
Admission: RE | Admit: 2024-07-06 | Discharge: 2024-07-06 | Disposition: A | Discharge status: Home / Self Care | Source: Ambulatory Visit | Attending: Cardiology | Admitting: Cardiology

## 2024-07-06 VITALS — BP 120/70 | HR 68 | Wt 236.0 lb

## 2024-07-06 DIAGNOSIS — Z79899 Other long term (current) drug therapy: Secondary | ICD-10-CM | POA: Diagnosis not present

## 2024-07-06 DIAGNOSIS — E119 Type 2 diabetes mellitus without complications: Secondary | ICD-10-CM | POA: Insufficient documentation

## 2024-07-06 DIAGNOSIS — Z87891 Personal history of nicotine dependence: Secondary | ICD-10-CM | POA: Diagnosis not present

## 2024-07-06 DIAGNOSIS — M19071 Primary osteoarthritis, right ankle and foot: Secondary | ICD-10-CM | POA: Insufficient documentation

## 2024-07-06 DIAGNOSIS — I5022 Chronic systolic (congestive) heart failure: Secondary | ICD-10-CM | POA: Diagnosis present

## 2024-07-06 DIAGNOSIS — Z6836 Body mass index (BMI) 36.0-36.9, adult: Secondary | ICD-10-CM | POA: Diagnosis not present

## 2024-07-06 DIAGNOSIS — J449 Chronic obstructive pulmonary disease, unspecified: Secondary | ICD-10-CM | POA: Diagnosis not present

## 2024-07-06 DIAGNOSIS — Z7984 Long term (current) use of oral hypoglycemic drugs: Secondary | ICD-10-CM | POA: Insufficient documentation

## 2024-07-06 DIAGNOSIS — R5383 Other fatigue: Secondary | ICD-10-CM | POA: Insufficient documentation

## 2024-07-06 DIAGNOSIS — I77810 Thoracic aortic ectasia: Secondary | ICD-10-CM | POA: Insufficient documentation

## 2024-07-06 DIAGNOSIS — E669 Obesity, unspecified: Secondary | ICD-10-CM | POA: Insufficient documentation

## 2024-07-06 DIAGNOSIS — E785 Hyperlipidemia, unspecified: Secondary | ICD-10-CM | POA: Insufficient documentation

## 2024-07-06 DIAGNOSIS — I509 Heart failure, unspecified: Secondary | ICD-10-CM

## 2024-07-06 DIAGNOSIS — I2510 Atherosclerotic heart disease of native coronary artery without angina pectoris: Secondary | ICD-10-CM | POA: Insufficient documentation

## 2024-07-06 DIAGNOSIS — I428 Other cardiomyopathies: Secondary | ICD-10-CM | POA: Insufficient documentation

## 2024-07-06 DIAGNOSIS — I358 Other nonrheumatic aortic valve disorders: Secondary | ICD-10-CM | POA: Insufficient documentation

## 2024-07-06 LAB — BASIC METABOLIC PANEL WITH GFR
Anion gap: 10 (ref 5–15)
BUN: 20 mg/dL (ref 8–23)
CO2: 30 mmol/L (ref 22–32)
Calcium: 9.9 mg/dL (ref 8.9–10.3)
Chloride: 100 mmol/L (ref 98–111)
Creatinine, Ser: 1.16 mg/dL (ref 0.61–1.24)
GFR, Estimated: >60 mL/min
Glucose, Bld: 93 mg/dL (ref 70–99)
Potassium: 4.4 mmol/L (ref 3.5–5.1)
Sodium: 140 mmol/L (ref 135–145)

## 2024-07-06 LAB — PRO BRAIN NATRIURETIC PEPTIDE: Pro Brain Natriuretic Peptide: <50 pg/mL

## 2024-07-06 LAB — ECHOCARDIOGRAM COMPLETE
Area-P 1/2: 2.75 cm2
S' Lateral: 3.2 cm
Weight: 3776 [oz_av]

## 2024-07-06 LAB — LIPID PANEL
Cholesterol: 153 mg/dL (ref 0–200)
HDL: 39 mg/dL — ABNORMAL LOW
LDL Cholesterol: 68 mg/dL (ref 0–99)
Total CHOL/HDL Ratio: 3.9 ratio
Triglycerides: 230 mg/dL — ABNORMAL HIGH
VLDL: 46 mg/dL — ABNORMAL HIGH (ref 0–40)

## 2024-07-06 MED ORDER — CARVEDILOL 12.5 MG PO TABS: 12.5000 mg | ORAL_TABLET | Freq: Two times a day (BID) | ORAL | 3 refills | Status: AC

## 2024-07-06 MED ORDER — EMPAGLIFLOZIN 25 MG PO TABS: 25.0000 mg | ORAL_TABLET | Freq: Every day | ORAL | 11 refills | Status: AC

## 2024-07-06 MED ORDER — SACUBITRIL-VALSARTAN 24-26 MG PO TABS: 1.0000 | ORAL_TABLET | Freq: Two times a day (BID) | ORAL | 1 refills | Status: AC

## 2024-07-06 MED ORDER — IVABRADINE HCL 5 MG PO TABS: 5.0000 mg | ORAL_TABLET | Freq: Two times a day (BID) | ORAL | 3 refills | Status: AC

## 2024-07-06 MED ORDER — ROSUVASTATIN CALCIUM 20 MG PO TABS: 20.0000 mg | ORAL_TABLET | Freq: Every day | ORAL | 3 refills | Status: AC

## 2024-07-06 MED ORDER — LISINOPRIL 5 MG PO TABS: 5.0000 mg | ORAL_TABLET | Freq: Every day | ORAL | 3 refills | Status: AC

## 2024-07-06 MED ORDER — FUROSEMIDE 40 MG PO TABS: 40.0000 mg | ORAL_TABLET | ORAL | 5 refills | Status: AC

## 2024-07-06 MED ORDER — SPIRONOLACTONE 25 MG PO TABS: 25.0000 mg | ORAL_TABLET | Freq: Every evening | ORAL | 0 refills | Status: AC

## 2024-07-06 NOTE — Patient Instructions (Signed)
 There has been no changes to your medications.  Labs done today, your results will be available in MyChart, we will contact you for abnormal readings.  You have been referred to the HEART CARE PHARMACY TEAM. They will call you to arrange your appointment.  Your physician recommends that you schedule a follow-up appointment in: 6 weeks.  If you have any questions or concerns before your next appointment please send us  a message through [place] or call our office at [phone].    TO LEAVE A MESSAGE FOR THE NURSE SELECT OPTION 2, PLEASE LEAVE A MESSAGE INCLUDING: YOUR NAME DATE OF BIRTH CALL BACK NUMBER REASON FOR CALL**this is important as we prioritize the call backs  YOU WILL RECEIVE A CALL BACK THE SAME DAY AS LONG AS YOU CALL BEFORE 4:00 PM  At the [facility], you and your health needs are our priority. As part of our continuing mission to provide you with exceptional heart care, we have created designated Provider Care Teams. These Care Teams include your primary Cardiologist (physician) and Advanced Practice Providers (APPs- Physician Assistants and Nurse Practitioners) who all work together to provide you with the care you need, when you need it.   You may see any of the following providers on your designated Care Team at your next follow up: Dr Toribio Fuel Dr Ezra Shuck Dr. Morene Brownie Greig Mosses, NP Caffie Shed, [place] [facility], [place] Beckey Coe, NP Jordan Lee, NP Ellouise Class, NP Tinnie Redman, PharmD Jaun Bash, PharmD   Please be sure to bring in all your medications bottles to every appointment.    Thank you for choosing [facility]

## 2024-07-07 ENCOUNTER — Telehealth (HOSPITAL_COMMUNITY): Payer: Self-pay | Admitting: *Deleted

## 2024-07-07 NOTE — Progress Notes (Signed)
 "     Date:  07/07/2024   ID:  [name], DOB Feb 13, 1951, MRN [id number]   Provider location: [facility] Type of Visit: Established patient  PCP:  Loreli Kins, MD  Cardiologist:  Dr. Rolan  Chief Complaint: Fatigue   History of Present Illness: [name] is a 74 y.o. male with a history of NICM, chronic systolic heart failure, COPD, and DM2.  He was admitted in 1/16 with chest pain and volume overload. He had an ECHO in 2016 that showed EF 20-25%. Underwent cath as noted below. He was discharged on low dose bb, lisinopril , digoxin , lasix , and spironolactone . Cardiac MRI in 8/16 showed EF remaining 31%.  Echo (1/18) with EF 50-55%, mild LV dilation.   Echo in 3/21 showed EF a bit lower than last study, 40-45% with severe LVH, normal RV, mild MR, normal IVC.    He now lives on [address].  I have not seen him since 2021.  [place]ologist.  He is taking most of his medications but is off spironolactone  and never started Entresto . Main complaint is right ankle arthritis. This limits long-distance walking.  He can walk about 2-3 blocks then has to stop due to pain.  Weight has trended up.  He likes to fish, takes his boat out most days.  Has some generalized fatigue but able to do yardwork and recently repaired his deck with no problems.  No significant exertional dyspnea or chest pain.   Echo was done today in the office, EF 35-40% (similar to prior).    ECG (personally reviewed): NSR, LAFB, poor RWP.    CPX 11/08/2014 Peak VO2: 17.8 (70.8% predicted peak VO2)  VE/VCO2 slope: 25.3  OUES: 2.27 Peak RER:    1.00   RHC/LHC 07/18/2014 RA:  5 RV:  29/8/9 PA:  30/11 (21) PCWP: 16 Fick CO/CI: 6.0/3.0 PVR: 0.8 WU SVR: 1042 Ao sat: 94% PA sats: 73%, 74% SVC sat: 72% Large vessel. Wraps apex. Small D1. Large D2. 20-30% plaque in proximal and distal LAD and D2   - ECHO 07/15/14 EF 20-25% Grade III DD - ECHO  10/18/2014 EF 25-30%, diffuse hypokinesis, mildly decreased RV systolic function with normal RV size.  - Cardiac MRI (8/16) with EF 31%, moderate LVH, normal RV size with mildly decreased systolic function, LGE at the RV insertion sites (nonspecific, suggestive of volume overload).  - Echo 05/01/15 EF 40% with diffuse hypokinesis.  - Echo (1/18): EF 50-55%, mild LV dilation - Echo (3/21): EF 40-45%, basal to mid anteroseptal AK, severe LVH, normal RV size and systolic function, mild MR, IVC normal.  - Echo (1/26): EF 35-40%, normal RV.    Labs 2/19: K 4.7, creatinine 1.12, LDL 142, TGs 259, HDL 41 Labs 3/21: LFTs normal, LDL 115, K 4.6, creatinine 1.14  Past Medical History:  Diagnosis Date   Chronic systolic CHF (congestive heart failure) (HCC)    COPD (chronic obstructive pulmonary disease) (HCC)    Diabetes mellitus, type II (HCC)    Obesity    Past Surgical History:  Procedure Laterality Date   LEFT AND RIGHT HEART CATHETERIZATION WITH CORONARY ANGIOGRAM N/A 07/18/2014   Procedure: LEFT AND RIGHT HEART CATHETERIZATION WITH CORONARY ANGIOGRAM;  Surgeon: Toribio JONELLE Fuel, MD;  Location: [facility] CATH LAB;  Service: Cardiovascular;  Laterality: N/A;     Current Outpatient Medications  Medication Sig Dispense Refill   albuterol (PROVENTIL HFA;VENTOLIN HFA)  108 (90 BASE) MCG/ACT inhaler Inhale 1 puff into the lungs every 6 (six) hours as needed for wheezing or shortness of breath (sometimes allergy). Reported on 07/24/2015     docusate sodium  (COLACE) 100 MG capsule Take 100 mg by mouth 2 (two) times daily as needed for mild constipation. Reported on 10/31/2015     furosemide  (LASIX ) 40 MG tablet Take 1 tablet (40 mg total) by mouth daily as needed. 60 tablet 6   glipiZIDE (GLUCOTROL) 10 MG tablet Take 10 mg by mouth 2 (two) times daily before a meal.     loperamide (IMODIUM A-D) 2 MG tablet Take 2 mg by mouth 4 (four) times daily as needed for diarrhea or loose stools.     QC LO-DOSE ASPIRIN  81  MG EC tablet TAKE 1 TABLET BY MOUTH DAILY. 30 tablet 11   simethicone  (MYLICON) 80 MG chewable tablet Chew 1 tablet (80 mg total) by mouth every 6 (six) hours as needed for flatulence. 30 tablet 0   carvedilol  (COREG ) 12.5 MG tablet Take 1 tablet (12.5 mg total) by mouth 2 (two) times daily with a meal. 180 tablet 3   empagliflozin  (JARDIANCE ) 25 MG TABS tablet Take 1 tablet (25 mg total) by mouth daily. 30 tablet 11   Empagliflozin -Linagliptin  (GLYXAMBI) 25-5 MG TABS Take by mouth. (Patient not taking: Reported on 07/06/2024)     furosemide  (LASIX ) 40 MG tablet Take 1 tablet (40 mg total) by mouth once a week. prn 30 tablet 5   ivabradine  (CORLANOR) 5 MG TABS tablet Take 1 tablet (5 mg total) by mouth 2 (two) times daily with a meal. 180 tablet 3   lisinopril  (ZESTRIL ) 5 MG tablet Take 1 tablet (5 mg total) by mouth daily. 90 tablet 3   metFORMIN  (GLUCOPHAGE ) 500 MG tablet Take 1,000 mg by mouth 2 (two) times daily with a meal.  (Patient not taking: Reported on 07/06/2024)     pantoprazole  (PROTONIX ) 40 MG tablet TAKE 1 TABLET (40 MG TOTAL) BY MOUTH DAILY. (Patient not taking: Reported on 07/06/2024) 30 tablet 5   rosuvastatin  (CRESTOR ) 20 MG tablet Take 1 tablet (20 mg total) by mouth daily. 90 tablet 3   sacubitril -valsartan  (ENTRESTO ) 24-26 MG Take 1 tablet by mouth 2 (two) times daily. 180 tablet 1   spironolactone  (ALDACTONE ) 25 MG tablet Take 1 tablet (25 mg total) by mouth every evening. 30 tablet 0   No current facility-administered medications for this encounter.    Allergies:   Iodine solution [povidone iodine]   Social History:  The patient  reports that he has quit smoking. He started smoking about 13 years ago. He has never used smokeless tobacco. He reports current alcohol use. He reports that he does not use drugs.   Family History:  The patient's family history includes Diabetes in his father.   ROS:  Please see the history of present illness.   All other systems are personally  reviewed and negative.   Exam:   BP 120/70   Pulse 68   Wt 107 kg (236 lb)   SpO2 95%   BMI 36.96 kg/m  General: NAD, obese.  Neck: No JVD, no thyromegaly or thyroid nodule.  Lungs: Clear to auscultation bilaterally with normal respiratory effort. CV: Nondisplaced PMI.  Heart regular S1/S2, no S3/S4, no murmur.  No peripheral edema.  No carotid bruit.  Normal pedal pulses.  Abdomen: Soft, nontender, no hepatosplenomegaly, no distention.  Skin: Intact without lesions or rashes.  Neurologic: Alert and oriented x  3.  Psych: Normal affect. Extremities: No clubbing or cyanosis.  HEENT: Normal.    Recent Labs: 07/06/2024: BUN 20; Creatinine, Ser 1.16; Potassium 4.4; Pro Brain Natriuretic Peptide <50.0; Sodium 140  Personally reviewed   Wt Readings from Last 3 Encounters:  07/06/24 107 kg (236 lb)  08/07/17 102.7 kg (226 lb 6.4 oz)  01/16/17 100.6 kg (221 lb 12.8 oz)     ASSESSMENT AND PLAN:  1. 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  Had cath 07/18/2014 without significant coronary disease. ECHO 06/2014 EF 20-25% => ECHO 5/16 EF 25-30%.  Cardiac MRI in 8/16 with EF 31%, RV insertion site LGE pattern, nonspecific.  Echo in 11/16 showed some improvement in EF to 40%.  Echo in 1/18 showed EF up to 50-55%. Echo in 3/21 showed EF down to 40-45%, severe LVH.  Echo today showed EF 35-40%, normal RV. NYHA class I-II symptoms, mostly limited by ankle pain.  Not volume overloaded on exam.  - Continue Lasix  prn.  - He is off spironolactone , thinks PCP may have stopped due to problems with creatinine. .   - Continue Coreg  12.5 mg bid.  - Continue lisinopril  5 mg daily.     - Continue Corlanor 5 mg twice a day.    - Continue Jardiance  25 mg daily.  - Will get BMET/BNP today.  He is going to come back in 6 wks, if creatinine and K are stable, will need to work on transition from lisinopril  to Entresto  and starting back on spironolactone .  Will need close  followup of BMET with these changes.   2.  Hyperlipidemia: Continue Crestor .  - Check lipids today.     3. Type II diabetes: He is on empagliflozin .   He will followup in 6 wks.   I spent 32 minutes reviewing records, interviewing/examining patient, and managing orders.    Signed, Ezra Shuck, MD  07/07/2024  [facility] [address] [facility] [place] [address] [phone] (office) [phone] (fax) "

## 2024-07-07 NOTE — Telephone Encounter (Signed)
 Called patient per Dr. Rolan with following echo results:  EF similar to prior, 35-40%. No change to current plan, but at followup we will need to replace lisinopril  with Entresto .  Pt verbalized understanding of same. No further questions at this time.

## 2024-07-09 ENCOUNTER — Telehealth (HOSPITAL_COMMUNITY): Payer: Self-pay

## 2024-07-09 ENCOUNTER — Other Ambulatory Visit (HOSPITAL_COMMUNITY): Payer: Self-pay

## 2024-07-09 NOTE — Telephone Encounter (Signed)
 Advanced [facility] Patient Advocate Encounter  The patient was approved for a [employer] grant that will help cover the cost of Carvedilol , Entresto , Jardiance , Sporonolactone.  Total amount awarded, $7,500.  Effective: 06/09/2024 - 06/08/2025.  BIN [id number] PCN [id number] [id number] ID [id number]  Pharmacy provided with approval and processing information. Confirmed $0 copay for Jardiance . Patient informed via voicemail.  Rachel DEL, CPhT Rx Patient Advocate Phone: [phone]

## 2024-07-14 ENCOUNTER — Other Ambulatory Visit (HOSPITAL_COMMUNITY): Payer: Self-pay

## 2024-08-23 ENCOUNTER — Ambulatory Visit (HOSPITAL_COMMUNITY): Admitting: Cardiology
